# Patient Record
Sex: Male | Born: 1990 | Hispanic: Yes | Marital: Single | Smoking: Never smoker
Health system: Southern US, Community
[De-identification: ages and names within clinical notes are randomized; demographics above are authoritative.]

## PROBLEM LIST (undated history)

## (undated) HISTORY — PX: INNER EAR SURGERY: SHX679

---
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
## 2009-05-22 ENCOUNTER — Encounter (INDEPENDENT_AMBULATORY_CARE_PROVIDER_SITE_OTHER): Payer: Self-pay | Admitting: Nurse Practitioner

## 2009-05-22 ENCOUNTER — Ambulatory Visit: Payer: Self-pay | Admitting: Internal Medicine

## 2009-05-22 DIAGNOSIS — R030 Elevated blood-pressure reading, without diagnosis of hypertension: Secondary | ICD-10-CM | POA: Insufficient documentation

## 2009-05-22 LAB — CONVERTED CEMR LAB
Basophils Relative: 0 % (ref 0–1)
CO2: 22 meq/L (ref 19–32)
Creatinine, Ser: 1.04 mg/dL (ref 0.40–1.50)
Eosinophils Absolute: 0.1 10*3/uL (ref 0.0–0.7)
Eosinophils Relative: 2 % (ref 0–5)
Glucose, Bld: 90 mg/dL (ref 70–99)
HCT: 45.4 % (ref 39.0–52.0)
Hemoglobin: 15.5 g/dL (ref 13.0–17.0)
MCHC: 34.1 g/dL (ref 30.0–36.0)
MCV: 89.4 fL (ref 78.0–100.0)
Monocytes Absolute: 0.7 10*3/uL (ref 0.1–1.0)
Monocytes Relative: 12 % (ref 3–12)
RBC: 5.08 M/uL (ref 4.22–5.81)
Sodium: 139 meq/L (ref 135–145)
TSH: 0.536 microintl units/mL (ref 0.350–4.500)
Total Bilirubin: 0.6 mg/dL (ref 0.3–1.2)
Total Protein: 7.1 g/dL (ref 6.0–8.3)

## 2009-05-23 ENCOUNTER — Encounter (INDEPENDENT_AMBULATORY_CARE_PROVIDER_SITE_OTHER): Payer: Self-pay | Admitting: Nurse Practitioner

## 2009-06-02 ENCOUNTER — Ambulatory Visit: Payer: Self-pay | Admitting: Nurse Practitioner

## 2014-07-05 ENCOUNTER — Emergency Department (HOSPITAL_COMMUNITY)
Admission: EM | Admit: 2014-07-05 | Discharge: 2014-07-05 | Disposition: A | Discharge status: Home / Self Care | Payer: Commercial Managed Care - PPO | Attending: Emergency Medicine | Admitting: Emergency Medicine

## 2014-07-05 ENCOUNTER — Emergency Department (HOSPITAL_COMMUNITY): Payer: Commercial Managed Care - PPO

## 2014-07-05 ENCOUNTER — Encounter (HOSPITAL_COMMUNITY): Payer: Self-pay | Admitting: Emergency Medicine

## 2014-07-05 DIAGNOSIS — R55 Syncope and collapse: Secondary | ICD-10-CM | POA: Insufficient documentation

## 2014-07-05 LAB — CBG MONITORING, ED: Glucose-Capillary: 96 mg/dL (ref 70–99)

## 2014-07-05 MED ORDER — SODIUM CHLORIDE 0.9 % IV BOLUS (SEPSIS)
1000.0000 mL | Freq: Once | INTRAVENOUS | Status: AC
  Administered 2014-07-05: 1000 mL via INTRAVENOUS

## 2014-07-05 NOTE — Discharge Instructions (Signed)
Near-Syncope Near-syncope (commonly known as near fainting) is sudden weakness, dizziness, or feeling like you might pass out. During an episode of near-syncope, you may also develop pale skin, have tunnel vision, or feel sick to your stomach (nauseous). Near-syncope may occur when getting up after sitting or while standing for a long time. It is caused by a sudden decrease in blood flow to the brain. This decrease can result from various causes or triggers, most of which are not serious. However, because near-syncope can sometimes be a sign of something serious, a medical evaluation is required. The specific cause is often not determined. HOME CARE INSTRUCTIONS  Monitor your condition for any changes. The following actions may help to alleviate any discomfort you are experiencing:  Have someone stay with you until you feel stable.  Lie down right away and prop your feet up if you start feeling like you might faint. Breathe deeply and steadily. Wait until all the symptoms have passed. Most of these episodes last only a few minutes. You may feel tired for several hours.   Drink enough fluids to keep your urine clear or pale yellow.   If you are taking blood pressure or heart medicine, get up slowly when seated or lying down. Take several minutes to sit and then stand. This can reduce dizziness.  Follow up with your health care provider as directed. SEEK IMMEDIATE MEDICAL CARE IF:   You have a severe headache.   You have unusual pain in the chest, abdomen, or back.   You are bleeding from the mouth or rectum, or you have black or tarry stool.   You have an irregular or very fast heartbeat.   You have repeated fainting or have seizure-like jerking during an episode.   You faint when sitting or lying down.   You have confusion.   You have difficulty walking.   You have severe weakness.   You have vision problems.  MAKE SURE YOU:   Understand these instructions.  Will  watch your condition.  Will get help right away if you are not doing well or get worse. Document Released: 11/18/2005 Document Revised: 11/23/2013 Document Reviewed: 04/23/2013 Exit[employer] Patient Information 2015 [employer]. This information is not intended to replace advice given to you by your health care provider. Make sure you discuss any questions you have with your health care provider.  

## 2014-07-05 NOTE — ED Notes (Signed)
Per EMS, pt c/o near syncope. Pt was at work ( very hot environment per EMS) felt dizzy and hot. Pt felt near syncope so he laid on the floor before it occured. Pt denies loss of consciousness, injury,pain or nausea. No neuro deficit noted. Pt was treated with 500 cc NS prior to arrival. Per EMS pt had positive orthostatic VS.

## 2014-07-05 NOTE — ED Notes (Signed)
Patient transported to X-ray 

## 2014-07-05 NOTE — ED Provider Notes (Signed)
CSN: [id number]     Arrival date & time 07/05/14  1704 History   First MD Initiated Contact with Patient 07/05/14 1730     Chief Complaint  Patient presents with  . Near Syncope     (Consider location/radiation/quality/duration/timing/severity/associated sxs/prior Treatment) HPI  23 yo male with a chief complaint of near-syncope. 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 Patient started to feel very lightheaded laid down on the ground the symptoms subsided after a couple minutes. Patient states he has been drinking water okay today. Patient also said he has been eating normally today. Patient has never had this happen before. Patient denies any chest pain shortness of breath headache. On scene patient became very tachypneic and ended up having carpal pedal spasm. This resolved after EMS arrival.   History reviewed. No pertinent past medical history. Past Surgical History  Procedure Laterality Date  . Inner ear surgery Right    History reviewed. No pertinent family history. History  Substance Use Topics  . Smoking status: Never Smoker   . Smokeless tobacco: Not on file  . Alcohol Use: No    Review of Systems  Constitutional: Negative for fever and chills.  HENT: Negative for congestion and facial swelling.   Eyes: Negative for discharge and visual disturbance.  Respiratory: Negative for shortness of breath.   Cardiovascular: Negative for chest pain and palpitations.  Gastrointestinal: Negative for vomiting, abdominal pain and diarrhea.  Musculoskeletal: Negative for arthralgias and myalgias.  Skin: Negative for color change and rash.  Neurological: Negative for tremors, syncope and headaches.       Near syncope   Psychiatric/Behavioral: Negative for confusion and dysphoric mood.      Allergies  Review of patient's allergies indicates no known allergies.  Home Medications   Prior to Admission  medications   Not on File   BP 115/72  Pulse 75  Temp(Src) 98.3 F (36.8 C) (Oral)  Resp 16  Ht 5\' 1"  (1.549 m)  Wt 170 lb (77.111 kg)  BMI 32.14 kg/m2  SpO2 97% Physical Exam  Constitutional: He is oriented to person, place, and time. He appears well-developed and well-nourished.  HENT:  Head: Normocephalic and atraumatic.  Eyes: EOM are normal. Pupils are equal, round, and reactive to light.  Neck: Normal range of motion. Neck supple. No JVD present.  Cardiovascular: Normal rate and regular rhythm.  Exam reveals no gallop and no friction rub.   No murmur heard. Pulmonary/Chest: No respiratory distress. He has no wheezes.  Abdominal: He exhibits no distension. There is no rebound and no guarding.  Musculoskeletal: Normal range of motion.  Neurological: He is alert and oriented to person, place, and time.  Skin: No rash noted. No pallor.  Psychiatric: He has a normal mood and affect. His behavior is normal.    ED Course  Procedures (including critical care time) Labs Review Labs Reviewed - No data to display  Imaging Review No results found.   EKG Interpretation None      MDM   Final diagnoses:  None    23 yo M with the chief complaint of near-syncope.  Patient with unremarkable blood glucose EKG chest x-ray. Orthostatic with EMS. Story most likely vasovagal versus dehydration. Patient given fluid bolus with some improvement.  Vitals stable, suspect vasovagal.  Discharged home.    I have discussed the diagnosis/risks/treatment options with the patient and family and believe the pt to be  eligible for discharge home to follow-up with PCP. We also discussed returning to the ED immediately if new or worsening sx occur. We discussed the sx which are most concerning (e.g., repeat event, chest pain, shortness of breath) that necessitate immediate return. Medications administered to the patient during their visit and any new prescriptions provided to the patient are listed  below.  Medications given during this visit Medications  sodium chloride 0.9 % bolus 1,000 mL (0 mLs Intravenous Stopped 07/05/14 1930)    There are no discharge medications for this patient.    Melene Planan Javien Tesch, MD 07/05/14 [phone]

## 2014-07-05 NOTE — ED Notes (Signed)
Pt ambulated to restroom with no assistance. ?

## 2014-07-05 NOTE — ED Notes (Signed)
Pt stated he felt no dizziness while walking to the bathroom.

## 2014-07-06 NOTE — ED Provider Notes (Signed)
I saw and evaluated the patient, reviewed the resident's note and I agree with the findings and plan.   EKG Interpretation   Date/Time:  Tuesday July 05 2014 17:23:45 EDT Ventricular Rate:  76 PR Interval:  145 QRS Duration: 82 QT Interval:  382 QTC Calculation: 429 R Axis:   71 Text Interpretation:  Sinus rhythm Anteroseptal infarct, age indeterminate  ST elevation, consider inferior injury Lateral leads are also involved  inferior q waves noted Confirmed by Haruto Demaria, MD, Janey GentaANKIT [phone]([id number]) on  07/05/2014 5:41:04 PM      Pt comes in with syncope. His cardiac hx, and exam is benign - including fam hx. Appears clinically to be a vasovagal event or orrthostatic event. Monitored in the ER - and had no cardiac events.  Derwood KaplanAnkit Alize Borrayo, MD 07/06/14 [phone]

## 2015-04-10 ENCOUNTER — Ambulatory Visit: Payer: Worker's Compensation

## 2015-04-10 ENCOUNTER — Ambulatory Visit (INDEPENDENT_AMBULATORY_CARE_PROVIDER_SITE_OTHER): Payer: Worker's Compensation | Admitting: Emergency Medicine

## 2015-04-10 VITALS — BP 118/78 | HR 70 | Temp 97.4°F | Resp 16 | Ht 61.0 in | Wt 180.2 lb

## 2015-04-10 DIAGNOSIS — S60212A Contusion of left wrist, initial encounter: Secondary | ICD-10-CM | POA: Diagnosis not present

## 2015-04-10 DIAGNOSIS — M25532 Pain in left wrist: Secondary | ICD-10-CM

## 2015-04-10 MED ORDER — NAPROXEN SODIUM 550 MG PO TABS: 550.0000 mg | ORAL_TABLET | Freq: Two times a day (BID) | ORAL | Status: DC

## 2015-04-10 NOTE — Patient Instructions (Signed)

## 2015-04-10 NOTE — Progress Notes (Signed)
[facility] [address], [place] [id number] [phone]  Date:  04/10/2015   Name:  Ernest Preston   DOB:  10/19/1991   MRN:  [id number]  PCP:  No primary care provider on file.    Chief Complaint: Wrist Injury   History of Present Illness:  Ernest Preston is a 24 y.o. very pleasant male patient who presents with the following:  Injured his wrist when a cylinder fell and struck him in the radial aspect of his wrist He has pain with movement Occurred today. Denies other injury Says worse when uses wrist and less when rests. No improvement with over the counter medications or other home remedies.  Denies other complaint or health concern today.   Patient Active Problem List   Diagnosis Date Noted  . ELEVATED BLOOD PRESSURE WITHOUT DIAGNOSIS OF HYPERTENSION 05/22/2009  . TYMPANIC MEMBRANE PERFORATION, RIGHT EAR 12/02/2006    History reviewed. No pertinent past medical history.  Past Surgical History  Procedure Laterality Date  . Inner ear surgery Right     History  Substance Use Topics  . Smoking status: Never Smoker   . Smokeless tobacco: Not on file  . Alcohol Use: No    Family History  Problem Relation Age of Onset  . Diabetes Mother     No Known Allergies  Medication list has been reviewed and updated.  No current outpatient prescriptions on file prior to visit.   No current facility-administered medications on file prior to visit.    Review of Systems:  Review of Systems  Constitutional: Negative for fever, chills and fatigue.  HENT: Negative for congestion, ear pain, hearing loss, postnasal drip, rhinorrhea and sinus pressure.   Eyes: Negative for discharge and redness.  Respiratory: Negative for cough, shortness of breath and wheezing.   Cardiovascular: Negative for chest pain and leg swelling.  Gastrointestinal: Negative for nausea, vomiting, abdominal pain, constipation and blood in stool.  Genitourinary: Negative for  dysuria, urgency and frequency.  Musculoskeletal: Negative for neck stiffness.  Skin: Negative for rash.  Neurological: Negative for seizures, weakness and headaches.   Physical Examination: Filed Vitals:   04/10/15 1405  BP: 118/78  Pulse: 70  Temp: 97.4 F (36.3 C)  Resp: 16   Filed Vitals:   04/10/15 1405  Height: 5\' 1"  (1.549 m)  Weight: 180 lb 3.2 oz (81.738 kg)   Body mass index is 34.07 kg/(m^2). Ideal Body Weight: Weight in (lb) to have BMI = 25: 132  GEN: WDWN, NAD, Non-toxic, A & O x 3 HEENT: Atraumatic, Normocephalic. Neck supple. No masses, No LAD. Ears and Nose: No external deformity. CV: RRR, No M/G/R. No JVD. No thrill. No extra heart sounds. PULM: CTA B, no wheezes, crackles, rhonchi. No retractions. No resp. distress. No accessory muscle use. ABD: S, NT, ND, +BS. No rebound. No HSM. EXTR: No c/c/e NEURO Normal gait.  PSYCH: Normally interactive. Conversant. Not depressed or anxious appearing.  Calm demeanor.  LEFT wrist:  No deformity, ecchymosis.  Some radial tenderness.  Full AROM.  Assessment and Plan: 1. Wrist pain, acute, left anaprox - DG Wrist Complete Left  2. Contusion, wrist, left, initial encounter Anaprox RICE Cock up splint Light duty for 1 week    Signed Phillips OdorJeffery Laneshia Pina, MD   [facility] reading (PRIMARY) by  Dr. Dareen PianoAnderson.  negative.

## 2015-04-18 ENCOUNTER — Ambulatory Visit (INDEPENDENT_AMBULATORY_CARE_PROVIDER_SITE_OTHER): Payer: Worker's Compensation | Admitting: Family Medicine

## 2015-04-18 VITALS — BP 114/66 | HR 98 | Temp 97.9°F | Resp 18 | Ht 61.0 in | Wt 180.0 lb

## 2015-04-18 DIAGNOSIS — S66911D Strain of unspecified muscle, fascia and tendon at wrist and hand level, right hand, subsequent encounter: Secondary | ICD-10-CM

## 2015-04-18 MED ORDER — PREDNISONE 20 MG PO TABS: 40.0000 mg | ORAL_TABLET | Freq: Every day | ORAL | Status: DC

## 2015-04-18 NOTE — Patient Instructions (Signed)
Please return on Friday so we can check your wrist again see how it is coming.

## 2015-04-18 NOTE — Progress Notes (Signed)
° °  Subjective:    Patient ID: Ernest Preston, male    DOB: 10/01/1991, 24 y.o.   MRN: [id number] This chart was scribed for [name], MD by Jolene Provostobert Halas, Medical Scribe. This patient was seen in Room 3 and the patient's care was started a 6:15 PM.  Chief Complaint  Patient presents with   Follow-up    wrist injury     HPI HPI Comments: Ernest Preston is a 24 y.o. male who [facility] complaining of left wrist injury that occurred eight days ago at work. Pt states he was hit by a falling object on his right wrist. Pt [facility] after his injury and was given a splint and put on light duty. Pt states he attempted to go back to normal duty at work yesterday, and experienced pain, which is why [facility]. Pt also states he has pain with heavy lifting.     Review of Systems  Constitutional: Negative for fever and chills.  Musculoskeletal: Negative for joint swelling.       Right wrist pain  Skin: Negative for color change and wound.  Neurological: Positive for weakness (Secondary to pain). Negative for numbness.       Objective:   Physical Exam  Constitutional: He is oriented to person, place, and time. He appears well-developed and well-nourished. No distress.  Pt has congenital nystagmus  HENT:  Head: Normocephalic and atraumatic.  Eyes: Pupils are equal, round, and reactive to light.  Neck: Neck supple.  Cardiovascular: Normal rate.   Pulmonary/Chest: Effort normal. No respiratory distress.  Musculoskeletal: Normal range of motion.  Tender over distal radial styloid   Neurological: He is alert and oriented to person, place, and time. Coordination normal.  Skin: Skin is warm and dry. He is not diaphoretic.  Psychiatric: He has a normal mood and affect. His behavior is normal.  Nursing note and vitals reviewed.      Assessment & Plan:   This chart was scribed in my presence and reviewed by me personally.    ICD-9-CM ICD-10-CM   1. Wrist strain, right, subsequent encounter V58.89 S66.911D predniSONE (DELTASONE) 20 MG tablet   842.00     Patient is developing some features of deQuervain's synovitis.  We'll recheck his wrist in several days and he's not better consider injection at the superficial radial nerve  Signed, [name], MD

## 2015-04-19 ENCOUNTER — Telehealth: Payer: Self-pay

## 2015-04-19 NOTE — Telephone Encounter (Signed)
Pt was seen for w/c injury yesterday by dr Milus Glazierlauenstein. 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

## 2015-04-19 NOTE — Telephone Encounter (Signed)
Dr L- Please address.

## 2015-04-21 ENCOUNTER — Ambulatory Visit (INDEPENDENT_AMBULATORY_CARE_PROVIDER_SITE_OTHER): Payer: Worker's Compensation | Admitting: Family Medicine

## 2015-04-21 VITALS — BP 112/64 | HR 98 | Temp 98.9°F | Resp 18 | Ht 61.0 in | Wt 178.0 lb

## 2015-04-21 DIAGNOSIS — S60212D Contusion of left wrist, subsequent encounter: Secondary | ICD-10-CM | POA: Diagnosis not present

## 2015-04-21 NOTE — Progress Notes (Signed)
Subjective:    Patient ID: Ernest Preston, male    DOB: 02/17/1991, 24 y.o.   MRN: [id number] This chart was scribed for Ernest SidleKurt Lauenstein, MD by Chestine SporeSoijett Blue, ED Scribe. The patient was seen in room 12 at 4:19 PM.   Chief Complaint  Patient presents with   Follow-up    W/C wrist    HPI  Ernest Preston is a 24 y.o. male who presents today complaining of f/u for workers comp. He states that he is having associated symptoms of mild myalgia of the left wrist. He states that he has tried splint and Rx medications with relief for his symptoms. He denies joint swelling, left shoulder pain, left elbow pain, and any other symptoms.   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   Prior Note 04/18/15:  Ernest Friesddie Shambley is a 24 y.o. male who [facility] of left wrist injury that occurred eight days ago at work. Pt states he was hit by a falling object on his right wrist. [facility] his injury and was given a splint and put on light duty. Pt states he attempted to go back to normal duty at work yesterday, and experienced pain, which is [facility]. Pt also states he has pain with heavy lifting.     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 No pertinent past medical history. Past Surgical History  Procedure Laterality Date   Inner ear surgery Right    No Known Allergies Prior to Admission medications   Medication Sig Start Date End Date Taking? Authorizing Provider  naproxen sodium (ANAPROX DS) 550 MG tablet Take 1 tablet (550 mg total) by mouth 2 (two) times daily with a meal. 04/10/15 04/09/16 Yes Carmelina DaneJeffery S Anderson, MD  predniSONE (DELTASONE) 20 MG tablet Take 2 tablets (40 mg total) by mouth daily with breakfast. 04/18/15  Yes Ernest SidleKurt Lauenstein, MD       Review of Systems  Musculoskeletal: Positive for myalgias. Negative for joint swelling and arthralgias.  Skin: Negative for rash.  Neurological: Negative for weakness and numbness.       Objective:   Physical Exam  Constitutional: He is oriented to person, place, and time. He appears well-developed and well-nourished. No distress.  HENT:  Head: Normocephalic and atraumatic.  Eyes: EOM are normal.  Neck: Neck supple. No tracheal deviation present.  Cardiovascular: Normal rate.   Pulmonary/Chest: Effort normal. No respiratory distress.  Musculoskeletal: Normal range of motion.       Left wrist: He exhibits normal range of motion, no tenderness and no swelling.  FROM of the left wrist with no point tenderness. No swelling noted. No ecchymosis of the skin. Neg tenials and finkelstein test.   Neurological: He is alert and oriented to person, place, and time.  Skin: Skin is warm and dry.  Contusion to the left wrist as reported in previous notes.   Psychiatric: He has a normal mood and affect. His behavior is normal.  Nursing note and vitals reviewed.        BP 112/64 mmHg   Pulse 98   Temp(Src) 98.9 F (37.2 C)   Resp 18   Ht 5\' 1"  (1.549 m)   Wt 178 lb (80.74 kg)   BMI 33.65 kg/m2   SpO2 98%  Assessment & Plan:  DIAGNOSTIC STUDIES: Oxygen Saturation is 98% on RA, nl by my interpretation.    COORDINATION OF CARE: 4:25 PM-Discussed treatment plan which includes f/u in 1 week for a f/u with pt at bedside and pt agreed to plan.  This chart was scribed in my presence and reviewed by me personally.    ICD-9-CM ICD-10-CM   1. Wrist contusion, left, subsequent encounter V58.89 Z61.096ES60.212D    923.21       Signed, Ernest SidleKurt Lauenstein, MD

## 2015-04-23 NOTE — Telephone Encounter (Signed)
Note ready for pick up. Patient informed.

## 2015-04-28 ENCOUNTER — Ambulatory Visit (INDEPENDENT_AMBULATORY_CARE_PROVIDER_SITE_OTHER): Payer: Worker's Compensation | Admitting: Family Medicine

## 2015-04-28 VITALS — BP 124/72 | HR 96 | Temp 97.6°F | Resp 16 | Ht 61.0 in | Wt 182.8 lb

## 2015-04-28 DIAGNOSIS — S60212D Contusion of left wrist, subsequent encounter: Secondary | ICD-10-CM

## 2015-04-28 NOTE — Progress Notes (Signed)
Patient is a 24 year old male who presents for follow-up complaining of left wrist injury that occurred on May 9. He's hit by falling object on his wrist and he was given a splint put on light duty.  Patient reports that his wrist is gradually getting better. He has pain with heavy lifting so we have put him on light duty.  Objective: Left wrist shows no swelling or ecchymosis. He has mild tenderness in the radial  anatomical snuffbox. Range of motion is now normal.  Assessment: Gradual improvement. Still has some pain so I think protection is indicated.  Plan: Light duty another 5 days coupled with use of splint. Return on June 2 for anticipated final visit.  Signed, Sheila OatsKurt Jamilette Suchocki M.D.

## 2015-05-05 ENCOUNTER — Ambulatory Visit (INDEPENDENT_AMBULATORY_CARE_PROVIDER_SITE_OTHER): Payer: Worker's Compensation | Admitting: Emergency Medicine

## 2015-05-05 VITALS — BP 106/76 | HR 71 | Temp 98.1°F | Resp 16 | Ht 60.5 in | Wt 178.2 lb

## 2015-05-05 DIAGNOSIS — S60212D Contusion of left wrist, subsequent encounter: Secondary | ICD-10-CM

## 2015-05-05 NOTE — Progress Notes (Signed)
Subjective:  Patient ID: Ernest Preston, male    DOB: 08/21/1991  Age: 24 y.o. MRN: [id number]  CC: Follow-up   HPI Ernest Friesddie Santana presents  for evaluation of his left wrist. He was injured initially on 04/10/2015. He was struck in the wrist by a cylinder of some sort. He was treated conservatively and has improved. He has no pain. No limitation of motion. 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 And he wishes to return to work full duty. Is not taking any medication currently  Outpatient Prescriptions Prior to Visit  Medication Sig Dispense Refill  . predniSONE (DELTASONE) 20 MG tablet Take 2 tablets (40 mg total) by mouth daily with breakfast. (Patient not taking: Reported on 04/28/2015) 10 tablet 0  . naproxen sodium (ANAPROX DS) 550 MG tablet Take 1 tablet (550 mg total) by mouth 2 (two) times daily with a meal. (Patient not taking: Reported on 04/28/2015) 40 tablet 0   No facility-administered medications prior to visit.    History   Social History  . Marital Status: Single    Spouse Name: N/A  . Number of Children: N/A  . Years of Education: N/A   Social History Main Topics  . Smoking status: Never Smoker   . Smokeless tobacco: Not on file  . Alcohol Use: No  . Drug Use: No  . Sexual Activity: Not on file   Other Topics Concern  . None   Social History Narrative    Family History  Problem Relation Age of Onset  . Diabetes Mother     No past medical history on file.   Review of Systems  Constitutional: Negative for fever, chills and appetite change.  HENT: Negative for congestion, ear pain, postnasal drip, sinus pressure and sore throat.   Eyes: Negative for pain and redness.  Respiratory: Negative for cough, shortness of breath and wheezing.   Cardiovascular: Negative for leg swelling.  Gastrointestinal: Negative for nausea, vomiting, abdominal pain, diarrhea, constipation and blood in stool.  Endocrine: Negative for polyuria.    Genitourinary: Negative for dysuria, urgency, frequency and flank pain.  Musculoskeletal: Negative for gait problem.  Skin: Negative for rash.  Neurological: Negative for weakness and headaches.  Psychiatric/Behavioral: Negative for confusion and decreased concentration. The patient is not nervous/anxious.     Objective:  BP 106/76 mmHg  Pulse 71  Temp(Src) 98.1 F (36.7 C) (Oral)  Resp 16  Ht 5' 0.5" (1.537 m)  Wt 178 lb 3.2 oz (80.831 kg)  BMI 34.22 kg/m2  SpO2 98%  BP Readings from Last 3 Encounters:  05/05/15 106/76  04/28/15 124/72  04/21/15 112/64    Wt Readings from Last 3 Encounters:  05/05/15 178 lb 3.2 oz (80.831 kg)  04/28/15 182 lb 12.8 oz (82.918 kg)  04/21/15 178 lb (80.74 kg)    Physical Exam  Constitutional: He is oriented to person, place, and time. He appears well-developed and well-nourished.  HENT:  Head: Normocephalic and atraumatic.  Eyes: Conjunctivae are normal. Pupils are equal, round, and reactive to light.  Pulmonary/Chest: Effort normal.  Musculoskeletal: He exhibits no edema.  Neurological: He is alert and oriented to person, place, and time.  Skin: Skin is dry.  Psychiatric: He has a normal mood and affect. His behavior is normal. Thought content normal.    Lab Results  Component Value Date   WBC 6.3 05/22/2009   HGB 15.5 05/22/2009   HCT 45.4 05/22/2009   PLT 219 05/22/2009   GLUCOSE 90 05/22/2009  ALT 13 05/22/2009   AST 18 05/22/2009   NA 139 05/22/2009   K 4.1 05/22/2009   CL 106 05/22/2009   CREATININE 1.04 05/22/2009   BUN 16 05/22/2009   CO2 22 05/22/2009   TSH 0.536 05/22/2009      .  Assessment & Plan:   Bralon was seen today for follow-up.  Diagnoses and all orders for this visit:  Contusion, wrist, left, subsequent encounter   I have discontinued Mr. Fouts's naproxen sodium. I am also having him maintain his predniSONE.  No orders of the defined types were placed in this encounter.     Appropriate red flag conditions were discussed with the patient as well as actions that should be taken.  Patient expressed his understanding.  Follow-up: Return if symptoms worsen or fail to improve.  Carmelina Dane, MD

## 2016-02-19 ENCOUNTER — Ambulatory Visit (INDEPENDENT_AMBULATORY_CARE_PROVIDER_SITE_OTHER): Payer: Worker's Compensation | Admitting: Family Medicine

## 2016-02-19 ENCOUNTER — Ambulatory Visit: Payer: Worker's Compensation | Admitting: Family Medicine

## 2016-02-19 ENCOUNTER — Encounter: Payer: Self-pay | Admitting: Family Medicine

## 2016-02-19 VITALS — BP 110/70 | HR 60 | Temp 97.4°F | Resp 16 | Ht 60.75 in | Wt 182.2 lb

## 2016-02-19 DIAGNOSIS — S39012A Strain of muscle, fascia and tendon of lower back, initial encounter: Secondary | ICD-10-CM

## 2016-02-19 DIAGNOSIS — Y990 Civilian activity done for income or pay: Secondary | ICD-10-CM | POA: Diagnosis not present

## 2016-02-19 MED ORDER — METHOCARBAMOL 500 MG PO TABS: ORAL_TABLET | ORAL | Status: DC

## 2016-02-19 MED ORDER — NAPROXEN 500 MG PO TABS: 500.0000 mg | ORAL_TABLET | Freq: Two times a day (BID) | ORAL | Status: DC

## 2016-02-19 NOTE — Progress Notes (Signed)
Patient ID: Ernest Preston, male    DOB: 11/01/1991  Age: 25 y.o. MRN: [id number]  No chief complaint on file.   Subjective:   Patient was here earlier and I saw him. There is a separate note on him. 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 Please use the note from earlier if necessary.  Current allergies, medications, problem list, past/family and social histories reviewed.  Objective:  There were no vitals taken for this visit.  .  Assessment & Plan:   Assessment: No diagnosis found.    Plan: .  No orders of the defined types were placed in this encounter.    No orders of the defined types were placed in this encounter.         There are no Patient Instructions on file for this visit.   No Follow-up on file.   Brinleigh Tew, MD 02/19/2016
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
## 2016-02-19 NOTE — Patient Instructions (Addendum)
Apply ice for about 15 or 20 minutes, followed by some heat, 2 or 3 times daily to low back  Do gentle stretching exercises  Avoid heavy lifting or straining of the low back. Recommend trying to work light duty.  Take naproxen 500 mg one twice daily for pain and inflammation  In addition can take over-the-counter Tylenol (acetaminophen) 500 mg 2 tablets 3 times daily if needed for pain relief  Take the methocarbamol muscle relaxant one pill in the morning, 1 in the afternoon, and 2 at bedtime  Return Friday or Saturday for a recheck  Even though this has not been injured as a workers compensation, since the paperwork was done at the workplace I will give you a [employer]. T take back to your employer and discussed this.    IF you received an x-ray today, you will receive an invoice from [facility]. Please contact [facility] at [phone] with questions or concerns regarding your invoice.   IF you received labwork today, you will receive an invoice from [facility]. Please contact [facility] at [phone] with questions or concerns regarding your invoice.   Our billing staff will not be able to assist you with questions regarding bills from these companies.  You will be contacted with the lab results as soon as they are available. The fastest way to get your results is to activate your My Chart account. Instructions are located on the last page of this paperwork. If you have not heard from us regarding the results in 2 weeks, please contact this office.

## 2016-02-23 ENCOUNTER — Ambulatory Visit (INDEPENDENT_AMBULATORY_CARE_PROVIDER_SITE_OTHER): Payer: Worker's Compensation | Admitting: Internal Medicine

## 2016-02-23 VITALS — BP 116/64 | HR 70 | Temp 97.7°F | Resp 16 | Ht 61.0 in | Wt 186.0 lb

## 2016-02-23 DIAGNOSIS — Y990 Civilian activity done for income or pay: Secondary | ICD-10-CM | POA: Diagnosis not present

## 2016-02-23 DIAGNOSIS — S39012A Strain of muscle, fascia and tendon of lower back, initial encounter: Secondary | ICD-10-CM

## 2016-02-23 MED ORDER — METHOCARBAMOL 500 MG PO TABS
ORAL_TABLET | ORAL | Status: DC
Start: 2016-02-23 — End: 2017-03-04

## 2016-02-23 NOTE — Progress Notes (Signed)
   Subjective:  By signing my name below, I, Stann Oresung-Kai Tsai, attest that this documentation has been prepared under the direction and in the presence of Ellamae Siaobert Lysle Yero, MD. Electronically Signed: Stann Oresung-Kai Tsai, Scribe. 02/23/2016 , 12:22 PM .  Patient was seen in Room 9 .   Patient ID: Ernest Preston, male    DOB: 12/13/1990, 25 y.o.   MRN: [id number] Chief Complaint  Patient presents with  . Follow-up    back pain wc    HPI Ernest Friesddie Edds is a 25 y.o. male who [facility]. He was seen on 02/19/16 by Dr. Alwyn RenHopper for lumbar strain noticed while at work. 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 He was prescribed naproxen and robaxin.   He states that he's feeling generally better. He was able to return to work on light duty without hurting himself. If he stands up for a long time, he does feel some pulling pain in his back. He informs that he was able to get naproxen but not the muscle relaxer.   No Known Allergies   Current outpatient prescriptions:  .  naproxen (NAPROSYN) 500 MG tablet, Take 1 tablet (500 mg total) by mouth 2 (two) times daily with a meal., Disp: 30 tablet, Rfl: 0 .  methocarbamol (ROBAXIN) 500 MG tablet, Take 1 in the morning, 1 in the afternoon, and 2 at bedtime for muscle relaxant (Patient not taking: Reported on 02/23/2016), Disp: 40 tablet, Rfl: 0   Review of Systems  Constitutional: Negative for fever, chills and fatigue.  Gastrointestinal: Negative for nausea and vomiting.  Musculoskeletal: Positive for back pain. Negative for myalgias, joint swelling, arthralgias, gait problem, neck pain and neck stiffness.  Skin: Negative for rash and wound.  Neurological: Negative for dizziness, weakness and light-headedness.       Objective:   Physical Exam  Constitutional: He is oriented to person, place, and time. He appears well-developed and well-nourished. No distress.  HENT:  Head: Normocephalic and atraumatic.  Eyes: EOM are  normal. Pupils are equal, round, and reactive to light.  Neck: Neck supple.  Cardiovascular: Normal rate.   Pulmonary/Chest: Effort normal. No respiratory distress.  Musculoskeletal: Normal range of motion.  Non tender over thoracic and lumbar area without pain with rom, able to straight leg raise to 90 degrees bilaterally  Neurological: He is alert and oriented to person, place, and time.  Skin: Skin is warm and dry.  Psychiatric: He has a normal mood and affect. His behavior is normal.  Nursing note and vitals reviewed.  BP 116/64 mmHg  Pulse 70  Temp(Src) 97.7 F (36.5 C) (Oral)  Resp 16  Ht 5\' 1"  (1.549 m)  Wt 186 lb (84.369 kg)  BMI 35.16 kg/m2  SpO2 98%    Assessment & Plan:  I have completed the patient encounter in its entirety as documented by the scribe, with editing by me where necessary. Zyanna Leisinger P. Merla Richesoolittle, M.D.  Lumbar strain, initial encounter - Plan: methocarbamol (ROBAXIN) 500 MG tablet  Work related injury - Plan: methocarbamol (ROBAXIN) 500 MG tablet  Meds ordered this encounter  Medications  . methocarbamol (ROBAXIN) 500 MG tablet    Sig: Take 1 in the morning  and 2 at bedtime for muscle relaxant    Dispense:  40 tablet    Refill:  0    Workers compensation   Increase work slowly Exercises

## 2016-03-06 ENCOUNTER — Ambulatory Visit (INDEPENDENT_AMBULATORY_CARE_PROVIDER_SITE_OTHER): Payer: Worker's Compensation | Admitting: Physician Assistant

## 2016-03-06 VITALS — BP 110/70 | HR 72 | Temp 97.9°F | Resp 17 | Ht 61.0 in | Wt 183.0 lb

## 2016-03-06 DIAGNOSIS — Y990 Civilian activity done for income or pay: Secondary | ICD-10-CM

## 2016-03-06 DIAGNOSIS — M545 Low back pain, unspecified: Secondary | ICD-10-CM

## 2016-03-06 NOTE — Patient Instructions (Signed)
     IF you received an x-ray today, you will receive an invoice from [facility]. Please contact [facility] at [phone] with questions or concerns regarding your invoice.   IF you received labwork today, you will receive an invoice from [facility]. Please contact [facility] at [phone] with questions or concerns regarding your invoice.   Our billing staff will not be able to assist you with questions regarding bills from these companies.  You will be contacted with the lab results as soon as they are available. The fastest way to get your results is to activate your My Chart account. Instructions are located on the last page of this paperwork. If you have not heard from us regarding the results in 2 weeks, please contact this office.      

## 2016-03-06 NOTE — Progress Notes (Signed)
   03/06/2016 11:42 AM   DOB: 07/02/1991 / MRN: [id number]  SUBJECTIVE:  Ernest Preston is a 25 y.o. male presenting for the evaluation of improved "sharp" midline back pain that started 15 days ago while lifting at work. Associated symptoms include no other symptoms, and he denies weakness, numbness, tingling, dysuria, leg pain, leg weakness, incontinence.Treatments tried thus far include Rx strength naprosyn and muscle relaxants.    Review of Systems  Constitutional: Negative for fever and chills.  Eyes: Negative for blurred vision.  Respiratory: Negative for cough and shortness of breath.   Cardiovascular: Negative for chest pain.  Gastrointestinal: Negative for nausea and abdominal pain.  Genitourinary: Negative for dysuria, urgency and frequency.  Musculoskeletal: Positive for back pain. Negative for myalgias and falls.  Skin: Negative for rash.  Neurological: Negative for dizziness, tingling and headaches.  Psychiatric/Behavioral: Negative for depression. The patient is not nervous/anxious.     Problem list and medications reviewed and updated by myself where necessary, and exist elsewhere in the encounter.   OBJECTIVE:  BP 110/70 mmHg  Pulse 72  Temp(Src) 97.9 F (36.6 C) (Oral)  Resp 17  Ht 5\' 1"  (1.549 m)  Wt 183 lb (83.008 kg)  BMI 34.60 kg/m2  SpO2 98% CrCl cannot be calculated (Patient has no serum creatinine result on file.).  Physical Exam  Constitutional: He is oriented to person, place, and time. He appears well-developed. He does not appear ill.  Eyes: Conjunctivae and EOM are normal. Pupils are equal, round, and reactive to light.  Cardiovascular: Normal rate.   Pulmonary/Chest: Effort normal.  Abdominal: He exhibits no distension.  Musculoskeletal: Normal range of motion.       Lumbar back: Normal. He exhibits no tenderness, no swelling and no edema.  Neurological: He is alert and oriented to person, place, and time. He has normal strength and normal  reflexes. No cranial nerve deficit or sensory deficit. He displays a negative Romberg sign. Coordination and gait normal. GCS eye subscore is 4. GCS verbal subscore is 5. GCS motor subscore is 6.  Skin: Skin is warm and dry. He is not diaphoretic.  Psychiatric: He has a normal mood and affect.  Nursing note and vitals reviewed.   No results found for this or any previous visit (from the past 48 hour(s)).  No results found.  ASSESSMENT AND PLAN  Link Snufferddie was seen today for follow-up.  Diagnoses and all orders for this visit:  Work related injury: He is improved and will return for his next scheduled shift without restriction.  Advised 440 otc aleve in the event of future pain.  No follow up necessary.   Midline low back pain without sciatica: Resolving.  See problem 1.     The patient was advised to call or return to clinic if he does not see an improvement in symptoms or to seek the care of the closest emergency department if he worsens with the above plan.   Deliah BostonMichael Dorian Renfro, MHS, PA-C [facility] 03/06/2016 11:42 AM

## 2016-09-24 IMAGING — CR DG WRIST COMPLETE 3+V*L*
2 series · 2 of 2 positions shown · non-contrast
Comparison: None.

CLINICAL DATA: Wrist pain on the left, acute.  No known injury.

EXAM:
LEFT WRIST - COMPLETE 3+ VIEW

[PA]
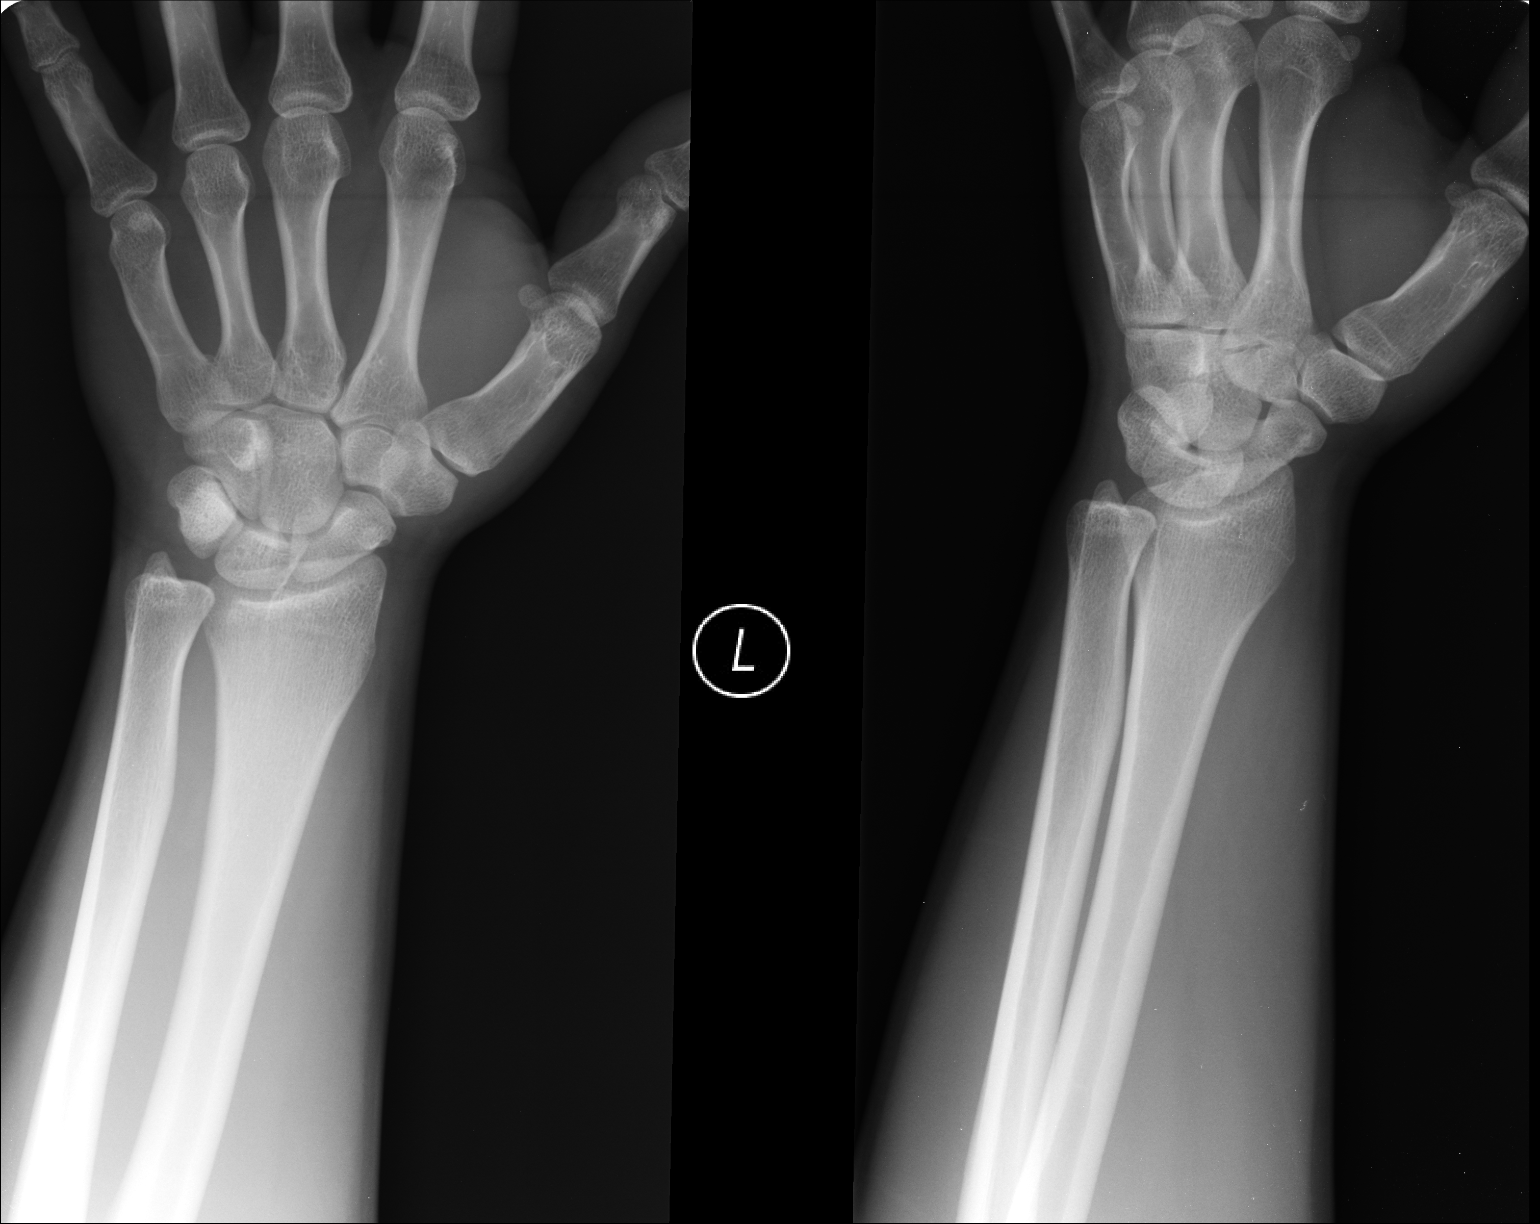

[lateral]
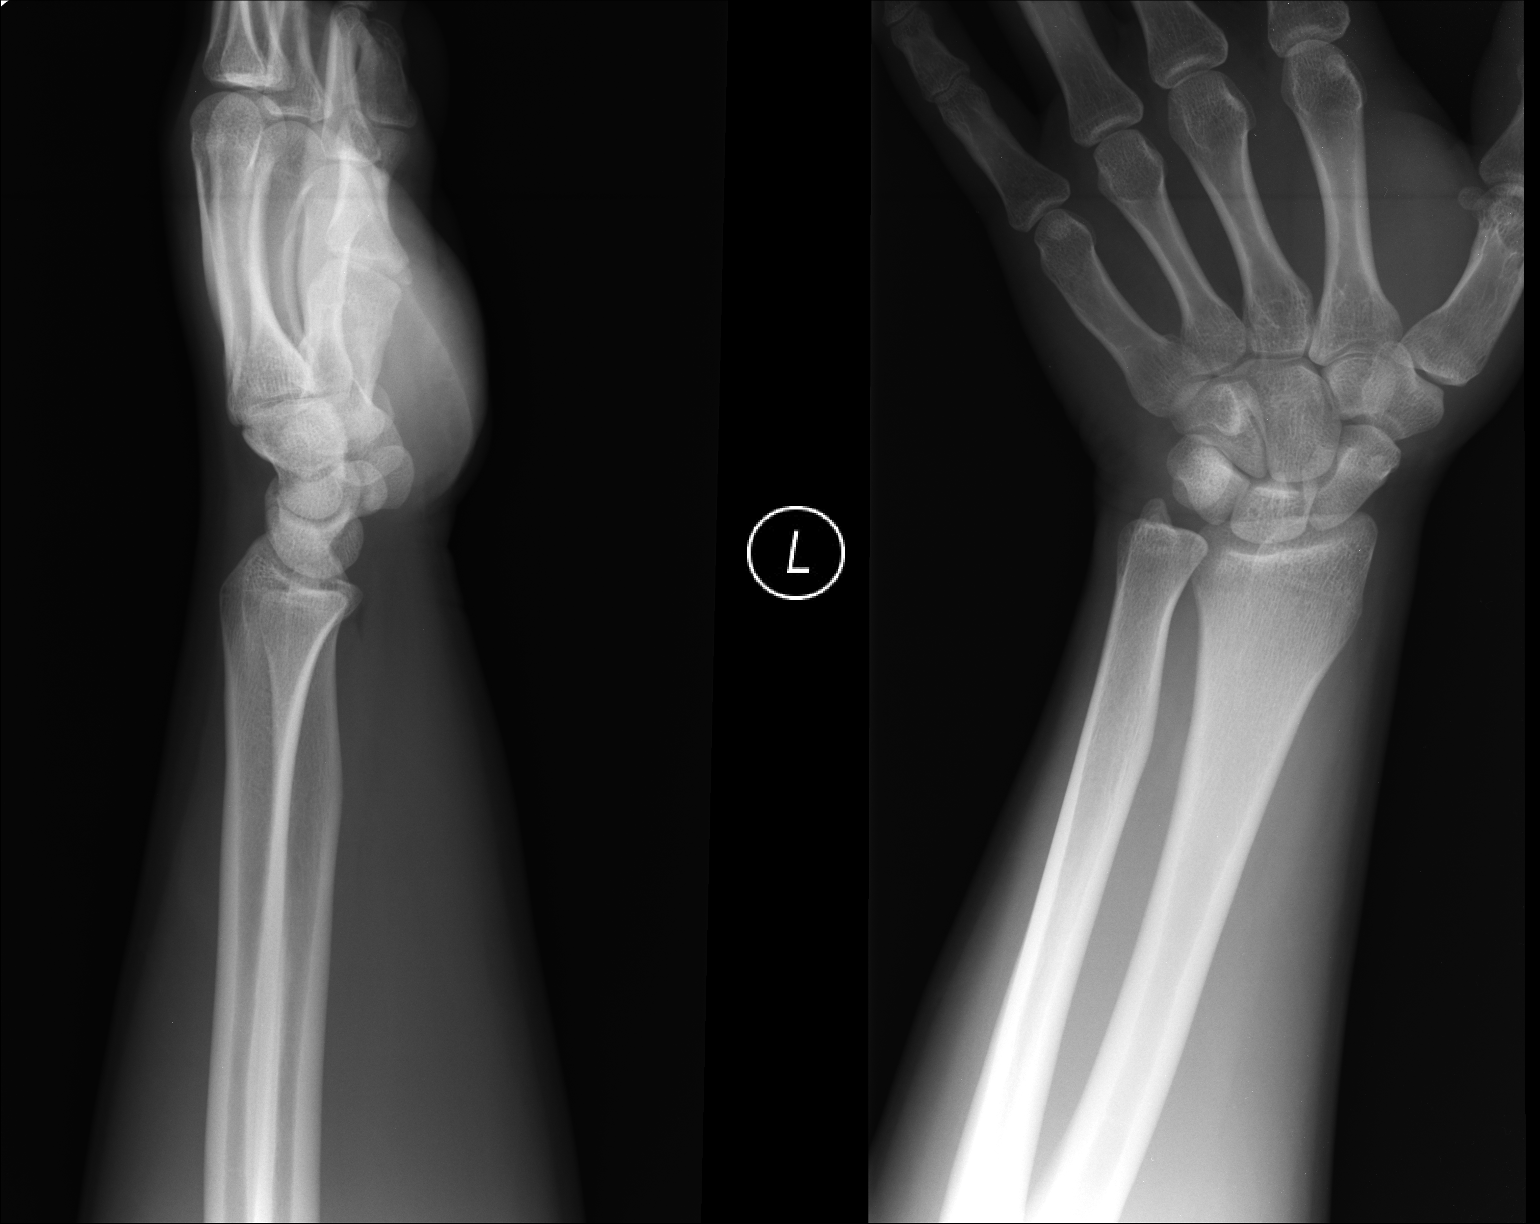

[2 of 2 positions shown; findings below may reference images not displayed]

FINDINGS: There is no evidence of fracture or dislocation. There is no
evidence of arthropathy or other focal bone abnormality. Soft
tissues are unremarkable.
IMPRESSION: Negative.

## 2017-03-04 ENCOUNTER — Ambulatory Visit (INDEPENDENT_AMBULATORY_CARE_PROVIDER_SITE_OTHER): Payer: Worker's Compensation

## 2017-03-04 ENCOUNTER — Ambulatory Visit (INDEPENDENT_AMBULATORY_CARE_PROVIDER_SITE_OTHER): Payer: Worker's Compensation | Admitting: Family Medicine

## 2017-03-04 VITALS — BP 116/78 | HR 64 | Temp 97.3°F | Ht 61.0 in | Wt 184.6 lb

## 2017-03-04 DIAGNOSIS — M25511 Pain in right shoulder: Secondary | ICD-10-CM | POA: Diagnosis not present

## 2017-03-04 DIAGNOSIS — M549 Dorsalgia, unspecified: Secondary | ICD-10-CM

## 2017-03-04 DIAGNOSIS — S39012A Strain of muscle, fascia and tendon of lower back, initial encounter: Secondary | ICD-10-CM | POA: Diagnosis not present

## 2017-03-04 DIAGNOSIS — Y990 Civilian activity done for income or pay: Secondary | ICD-10-CM | POA: Diagnosis not present

## 2017-03-04 MED ORDER — METHOCARBAMOL 500 MG PO TABS
ORAL_TABLET | ORAL | 0 refills | Status: DC
Start: 2017-03-04 — End: 2017-03-22

## 2017-03-04 MED ORDER — NAPROXEN 500 MG PO TABS: 500.0000 mg | ORAL_TABLET | Freq: Two times a day (BID) | ORAL | 0 refills | Status: DC

## 2017-03-04 NOTE — Progress Notes (Signed)
Patient ID: Ernest Preston, male    DOB: May 13, 1991  Age: 26 y.o. MRN: [id number]  Chief Complaint  Patient presents with  . Back Pain    X 1 day  . 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 He was fine this morning. He was at work and lifting a door when he had a sharp burning pain in his left upper back and shoulder area. Then it just tightened up and hurt. He went and told his manager and was sent over here for a Worker's Comp. evaluation.  A couple of years ago he had a back strain, but does not have chronic problems with his back. He is generally healthy. He does not do regular physical exercise but he does do a fairly manual job.  Current allergies, medications, problem list, past/family and social histories reviewed.  Objective:  BP 116/78 (BP Location: Right Arm, Patient Position: Sitting, Cuff Size: Small)   Pulse 64   Temp 97.3 F (36.3 C) (Oral)   Ht  (1.549 m)   Wt 184 lb 9.6 oz (83.7 kg)   SpO2 96%   BMI 34.88 kg/m   Pleasant, alert and oriented gentleman, modestly overweight. No major distress when seated still, but hurts when he moves around. His shoulders and torso appear normal. The right upper back is tender just above the medial half of the scapula, with tenderness down the paraspinous muscles medial to the right scapula and just below the scapula. He is tender along the upper to mid thoracic spine pain in about the fourth to eighth vertebra regions. Motor strength is symmetrical. He has good range of motion except when rotating to the right. It tightens up badly then. Straight leg raising is negative.  Assessment & Plan:   Assessment: 1. Mid back pain on right side   2. Acute pain of right shoulder   3. Lumbar strain, initial encounter   4. Work related injury       Plan: Will get an x-ray. Assuming the vertebra appear normal and the disc spaces looked okay we will treat symptomatically with medications and  rest.  Orders Placed This Encounter  Procedures  . DG Thoracic Spine 2 View    Standing Status:   Future    Number of Occurrences:   1    Standing Expiration Date:   03/04/2018    Order Specific Question:   Reason for Exam (SYMPTOM  OR DIAGNOSIS REQUIRED)    Answer:   mid to upper back pain, acute injury    Order Specific Question:   Preferred imaging location?    Answer:   External    No orders of the defined types were placed in this encounter.   FINDINGS: There is no evidence of thoracic spine fracture. Alignment is normal. Intervertebral disc spaces are maintained. No other significant bone abnormalities are identified.     Patient Instructions   Take naproxen 500 mg one twice daily at breakfast and supper for pain and inflammation in back  For additional pain you can take acetaminophen (Tylenol) 500 mg 2 pills maximum of 3 times daily  For muscle relaxant take the methocarbamol (Robaxin) 500 mg 1 in the morning, 1 in the afternoon, 2 at bedtime  Try alternating warm and cold packs on your back several times daily, to try and break the tightness of the muscle. Do gentle stretching but nothing vigorous.  Stay out of work  and return Friday for a recheck.    IF you received an x-ray today, you will receive an invoice from [facility]. Please contact [facility] at [phone] with questions or concerns regarding your invoice.   IF you received labwork today, you will receive an invoice from [facility]. Please contact [facility] at [phone] with questions or concerns regarding your invoice.   Our billing staff will not be able to assist you with questions regarding bills from these companies.  You will be contacted with the lab results as soon as they are available. The fastest way to get your results is to activate your My Chart account. Instructions are located on the last page of this paperwork. If you have not heard from [place] regarding the results in 2  weeks, please contact this office.         Return in about 3 days (around 03/07/2017).   Lyana Asbill, MD 03/04/2017

## 2017-03-04 NOTE — Patient Instructions (Addendum)
Take naproxen 500 mg one twice daily at breakfast and supper for pain and inflammation in back  For additional pain you can take acetaminophen (Tylenol) 500 mg 2 pills maximum of 3 times daily  For muscle relaxant take the methocarbamol (Robaxin) 500 mg 1 in the morning, 1 in the afternoon, 2 at bedtime  Try alternating warm and cold packs on your back several times daily, to try and break the tightness of the muscle. Do gentle stretching but nothing vigorous.  Stay out of work and return Friday for a recheck.    IF you received an x-ray today, you will receive an invoice from [facility]. Please contact [facility] at [phone] with questions or concerns regarding your invoice.   IF you received labwork today, you will receive an invoice from [facility]. Please contact [facility] at [phone] with questions or concerns regarding your invoice.   Our billing staff will not be able to assist you with questions regarding bills from these companies.  You will be contacted with the lab results as soon as they are available. The fastest way to get your results is to activate your My Chart account. Instructions are located on the last page of this paperwork. If you have not heard from [place] regarding the results in 2 weeks, please contact this office.

## 2017-03-07 ENCOUNTER — Ambulatory Visit (INDEPENDENT_AMBULATORY_CARE_PROVIDER_SITE_OTHER): Payer: Worker's Compensation | Admitting: Physician Assistant

## 2017-03-07 VITALS — BP 117/70 | HR 67 | Temp 97.8°F | Resp 17 | Ht 61.0 in | Wt 188.0 lb

## 2017-03-07 DIAGNOSIS — M25511 Pain in right shoulder: Secondary | ICD-10-CM

## 2017-03-07 DIAGNOSIS — M549 Dorsalgia, unspecified: Secondary | ICD-10-CM

## 2017-03-07 NOTE — Patient Instructions (Addendum)
Continue the prescribed medications, as needed. Take it easy this weekend, and you should be ready to go back to regular work on Monday!    IF you received an x-ray today, you will receive an invoice from [facility]. Please contact [facility] at [phone] with questions or concerns regarding your invoice.   IF you received labwork today, you will receive an invoice from [facility]. Please contact [facility] at [phone] with questions or concerns regarding your invoice.   Our billing staff will not be able to assist you with questions regarding bills from these companies.  You will be contacted with the lab results as soon as they are available. The fastest way to get your results is to activate your My Chart account. Instructions are located on the last page of this paperwork. If you have not heard from [place] regarding the results in 2 weeks, please contact this office.

## 2017-03-07 NOTE — Progress Notes (Signed)
     Ernest Preston 1991-07-18 26 y.o.   Chief Complaint  Patient presents with  . Follow-up    W/C back pain     Presents for evaluation of work-related complaint.  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 He presented here for evaluation. Radiographs were normal. He was prescribed naproxen, acetaminophen and methocarbamol, and advised to stay out of work and return today for re-evaluation.  Improving. 85% improved. Now 0/10, 3/10 with movement. Continues prescribed medications, tolerating them well. Thinks that they are helping. No radicular pain. No paresthesias. No weakness. The neck "stiffs up."   ROS As above.    Current medications and allergies reviewed and updated. Past medical history, family history, social history have been reviewed and updated.   Physical Exam  Constitutional: He is oriented to person, place, and time and well-developed, well-nourished, and in no distress.  BP 117/70   Pulse 67   Temp 97.8 F (36.6 C) (Oral)   Resp 17   Ht  (1.549 m)   Wt 188 lb (85.3 kg)   SpO2 97%   BMI 35.52 kg/m    Eyes: Conjunctivae are normal.  Neck: Normal range of motion. Neck supple. No tracheal deviation present. No thyromegaly present.  Pulmonary/Chest: Effort normal.  Musculoskeletal:       Right shoulder: Normal.       Left shoulder: Normal.       Cervical back: Normal.       Thoracic back: Normal.  Mild "tightness" with palpation of the RIGHT paraspinous muscles of the cervical spine and in the RIGHT trapezius.  Lymphadenopathy:    He has no cervical adenopathy.  Neurological: He is alert and oriented to person, place, and time. Gait normal.  Skin: Skin is warm and dry.  Psychiatric: Mood, memory, affect and judgment normal.     Assessment and Plan: 1. Mid back pain on right side 2. Acute pain of right  shoulder Improving. Continue current treatment. RTW Monday 4/09, unless not resolved, in which case he should return here.    Return if symptoms worsen or fail to improve.   Fernande Bras, PA-C [facility]

## 2017-03-10 ENCOUNTER — Telehealth: Payer: Self-pay | Admitting: Family Medicine

## 2017-03-10 ENCOUNTER — Ambulatory Visit (INDEPENDENT_AMBULATORY_CARE_PROVIDER_SITE_OTHER): Payer: Worker's Compensation | Admitting: Physician Assistant

## 2017-03-10 VITALS — BP 131/82 | HR 67 | Temp 98.3°F | Resp 18 | Ht 61.0 in | Wt 185.0 lb

## 2017-03-10 DIAGNOSIS — M549 Dorsalgia, unspecified: Secondary | ICD-10-CM | POA: Diagnosis not present

## 2017-03-10 NOTE — Progress Notes (Signed)
     Ernest Preston 02-Mar-1991 26 y.o.   Chief Complaint  Patient presents with  . Follow-up    w/c    Presents for evaluation of work-related complaint.  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 He presented here for evaluation. Radiographs were normal. He was prescribed naproxen, acetaminophen and methocarbamol, and advised to stay out of work and return today for re-evaluation.  At follow-up on 03/07/17 he was improving. Pain at rest was rated 0/10, 3/10 with movement. He was tolerating prescribed medications and felt that they are helping. No radicular pain. No paresthesias. No weakness. The neck "stiffs up."  He returned to work today as planned, feeling that he was ready, but as soon as he starting doing the overhead reaching required for his job, the pain recurred. 7/10 at work today. 4/10 now at rest.  No radicular pain. No numbness/tingling. No weakness.  ROM of the neck causes mild pain. No pain with ROM of the arm/shoulder.  ROS As above.    Current medications and allergies reviewed and updated. Past medical history, family history, social history have been reviewed and updated.   Physical Exam  Constitutional: He is oriented to person, place, and time and well-developed, well-nourished, and in no distress.  BP 131/82   Pulse 67   Temp 98.3 F (36.8 C) (Oral)   Resp 18   Ht  (1.549 m)   Wt 185 lb (83.9 kg)   SpO2 97%   BMI 34.96 kg/m    Eyes: Conjunctivae are normal.  Pulmonary/Chest: Effort normal.  Musculoskeletal:       Right shoulder: Normal.       Cervical back: Normal.       Back:       Right upper arm: He exhibits no tenderness, no bony tenderness, no swelling, no edema, no deformity and no laceration.  Neurological: He is alert and oriented to person, place, and time. Gait normal.  Skin: Skin  is warm and dry.  Psychiatric: Mood, memory, affect and judgment normal.     Assessment and Plan: 1. Mid back pain on right side Was improving, but relapse with with return to work. Out of work this week, re-evaluate on Friday 03/14/2017. Continue medications as before.    Return in about 4 days (around 03/14/2017) for re-evaluation.   Fernande Bras, PA-C Primary Care at [facility]

## 2017-03-10 NOTE — Telephone Encounter (Signed)
Please advise can he do this work or totally off work

## 2017-03-10 NOTE — Patient Instructions (Addendum)
Continue the medications. Minimal use of the RIGHT arm. No over head activities. No lifting, pushing, pulling >5 lbs.     IF you received an x-ray today, you will receive an invoice from [facility]. Please contact [facility] at [phone] with questions or concerns regarding your invoice.   IF you received labwork today, you will receive an invoice from [facility]. Please contact [facility] at [phone] with questions or concerns regarding your invoice.   Our billing staff will not be able to assist you with questions regarding bills from these companies.  You will be contacted with the lab results as soon as they are available. The fastest way to get your results is to activate your My Chart account. Instructions are located on the last page of this paperwork. If you have not heard from [place] regarding the results in 2 weeks, please contact this office.

## 2017-03-10 NOTE — Telephone Encounter (Signed)
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

## 2017-03-11 NOTE — Telephone Encounter (Signed)
V/m left for tate with chelles note

## 2017-03-11 NOTE — Telephone Encounter (Signed)
The patient told me that there was no option for light duty. He CAN RTW, minimal use RIGHT arm, no overhead activity, no reaching with the RIGHT arm, no pushing/pulling/lifting >5 lbs with the RIGHT arm.

## 2017-03-12 NOTE — Telephone Encounter (Signed)
Pt needs a letter asap in regards to the update of RTW with minimal use of right arm, etc.... Based on Chelle's previous note.

## 2017-03-12 NOTE — Telephone Encounter (Signed)
l/m for pt to pick up note

## 2017-03-13 ENCOUNTER — Telehealth: Payer: Self-pay | Admitting: Family Medicine

## 2017-03-13 NOTE — Telephone Encounter (Signed)
[employer] where pt work at needed [place] to fax over letter with pt restrictions for work I faxed over letter at 9:24 this morning at fax number [phone]

## 2017-03-14 ENCOUNTER — Ambulatory Visit (INDEPENDENT_AMBULATORY_CARE_PROVIDER_SITE_OTHER): Payer: Worker's Compensation | Admitting: Physician Assistant

## 2017-03-14 VITALS — BP 112/73 | HR 70 | Temp 98.0°F | Resp 17 | Ht 61.0 in | Wt 185.0 lb

## 2017-03-14 DIAGNOSIS — M549 Dorsalgia, unspecified: Secondary | ICD-10-CM

## 2017-03-14 NOTE — Progress Notes (Signed)
     Ernest Preston November 24, 1991 26 y.o.   Chief Complaint  Patient presents with  . Follow-up    WC/ April 3rd at work/ back injury    Presents for evaluation of work-related complaint.  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 He presented here for evaluation. Radiographs were normal. He was prescribed naproxen, acetaminophen and methocarbamol, and advised to stay out of work and return today for re-evaluation.  At follow-up on 03/07/17 he was improving. Pain at rest was rated 0/10, 3/10 with movement. He was tolerating prescribed medications and felt that they are helping. No radicular pain. No paresthesias. No weakness. The neck "stiffs up."  He returned to work 03/10/17 as planned, feeling that he was ready, but as soon as he starting doing the overhead reaching required for his job, the pain recurred. He reported 7/10 at work, 4/10 at rest. He told me that light duty was not an option, so I wrote him out of work, only to receive communication from his employer that light duty was indeed available. He was released to return to work with no lifting >5 lbs, no overhead activities.  Today he reports continued improvement. Pain comes and goes, 2/10. Stiffness, which he relates to decreased activity. He's not having increased pain with the work as restricted. No new pain.  No radicular pain. No numbness/tingling. No weakness.   ROS As above.    Current medications and allergies reviewed and updated. Past medical history, family history, social history have been reviewed and updated.   Physical Exam  Constitutional: He is oriented to person, place, and time and well-developed, well-nourished, and in no distress.  BP 112/73 (BP Location: Right Arm, Patient Position: Sitting, Cuff Size: Normal)   Pulse 70   Temp 98 F (36.7 C) (Oral)    Resp 17   Ht  (1.549 m)   Wt 185 lb (83.9 kg)   SpO2 98%   BMI 34.96 kg/m    Eyes: Conjunctivae are normal.  Cardiovascular:  Pulses:      Radial pulses are 2+ on the right side, and 2+ on the left side.  Pulmonary/Chest: Effort normal.  Musculoskeletal:       Right shoulder: Normal.       Left shoulder: Normal.       Cervical back: Normal.       Thoracic back: He exhibits tenderness, pain and spasm. He exhibits normal range of motion, no bony tenderness, no swelling, no edema, no deformity and no laceration.       Back:  Neurological: He is alert and oriented to person, place, and time. Gait normal.  Skin: Skin is warm and dry.  Psychiatric: Mood, memory, affect and judgment normal.     Assessment and Plan: 1. Mid back pain on right side Advance work restrictions to allow lifting up to 25 lbs and overhead 5 lbs. Continue previously prescribed medications. At home, do gentle ROM to reduce stiffness. Plan to advance again in approximately 1 week.    Return in 8 days (on 03/22/2017) for re-evaluation of back injury/WC.   Fernande Bras, PA-C Primary Care at [facility]

## 2017-03-14 NOTE — Progress Notes (Signed)
THIS NOTE IS USED FOR EDUCATIONAL PURPOSES ONLY!!!   Name: Ernest Preston  DOB: 04/01/1991  Age: 26 y.o. Sex: male  CC:  Chief Complaint  Patient presents with  . Back Injury    WC/ April 3rd at work    PCP: No PCP Per Patient  HPI: Patient returns today for R upper back/shoulder region when he lifted a door he was sanding off a table on 03/04/17. Given naproxen, acetaminophen, and methocarbamol, and advised to stay out of work.   At f/u on 03/07/17 he was improving. Returned to work on 03/10/17 but had pain with doing overhead reaching that is required for his job and pain recurred. He was told to stay out of work until his follow-up today.   Patient reports he has been lifting <5 lbs light duty at work. He reports he has had tightness in his right shoulder/back. He attributes this to "not working that muscle in a while." He denies any pain today. Reports a 2/10 at rest. He reports nothing is making his pain worse right now. No pain when he is doing the 5lb lifting at work.   Denies numbness/tingling.  No radicular pain.  Denies weakness in the arm.     ROS:  Constitutional: Negative for activity change, appetite change, fatigue and unexpected weight change.  HENT: Negative for congestion, dental problem, ear pain, hearing loss, mouth sores, postnasal drip, rhinorrhea, sneezing, sore throat, tinnitus and trouble swallowing.   Eyes: Negative for photophobia, pain, redness and visual disturbance.  Respiratory: Negative for cough, chest tightness and shortness of breath.   Cardiovascular: Negative for chest pain, palpitations and leg swelling.  Gastrointestinal: Negative for abdominal pain, blood in stool, constipation, diarrhea, nausea and vomiting.  Genitourinary: Negative for dysuria, frequency, hematuria and urgency.  Musculoskeletal: Negative for arthralgias, gait problem, myalgias and neck stiffness.  Skin: Negative for rash.  Neurological: Negative for dizziness, speech difficulty,  weakness, light-headedness, numbness and headaches.  Hematological: Negative for adenopathy.  Psychiatric/Behavioral: Negative for confusion and sleep disturbance. The patient is not nervous/anxious.    I have reviewed his PMH, allergies, medications, and social history and have adressed as needed.    PE:  GS: WDWN male sitting on exam table in NAD.  Vitals:  Vitals:   03/14/17 0812  BP: 112/73  Pulse: 70  Resp: 17  Temp: 98 F (36.7 C)   HEENT: Normocephalic, atruamatic. PEARRL. No cervical lymphadenopathy.  Cardiovascular: RRR. No S3 or S4. No murmurs, rubs, or gallops. Pulses 2+ and equal bilateral in the upper and lower extremities. No pitting edema. No varicosities, clubbing, or cyanosis.  Pulm: CTA bilaterally. No expiratory muscle use while breathing.  GI: +BS. NTND. No rigidity or guarding. No rebound tenderness.  MSK: Mild ttp just right of the thoracic spinus processes.  5/5 strength of shoulder. Sensation intact. Full AROM and PROM.  Neuro: CN 2-12 grossly intact.  Psych: A&O x 4. Mood and affect appropriate for situation.  Skin: Warm and dry. No rashes or excoriations on exposed skin.   A&P:  1. Mid back pain on right side - improving slowly.Continue current medications as needed.  Plan: Patient can return to work today with restrictions. He may not pull, push, or lift anything >25 lbs. He may not lift anything overhead >5 lbs beginning today.   F/u in 1 week for reevaluation.      Respectfully,  Camillia Herter, PA-S2

## 2017-03-14 NOTE — Patient Instructions (Signed)
     IF you received an x-ray today, you will receive an invoice from [facility]. Please contact [facility] at [phone] with questions or concerns regarding your invoice.   IF you received labwork today, you will receive an invoice from [facility]. Please contact [facility] at [phone] with questions or concerns regarding your invoice.   Our billing staff will not be able to assist you with questions regarding bills from these companies.  You will be contacted with the lab results as soon as they are available. The fastest way to get your results is to activate your My Chart account. Instructions are located on the last page of this paperwork. If you have not heard from us regarding the results in 2 weeks, please contact this office.     

## 2017-03-22 ENCOUNTER — Encounter: Payer: Self-pay | Admitting: Physician Assistant

## 2017-03-22 ENCOUNTER — Ambulatory Visit (INDEPENDENT_AMBULATORY_CARE_PROVIDER_SITE_OTHER): Payer: Worker's Compensation | Admitting: Physician Assistant

## 2017-03-22 VITALS — BP 102/66 | HR 74 | Temp 97.7°F | Resp 18 | Ht 61.0 in | Wt 186.0 lb

## 2017-03-22 DIAGNOSIS — S39012A Strain of muscle, fascia and tendon of lower back, initial encounter: Secondary | ICD-10-CM | POA: Diagnosis not present

## 2017-03-22 DIAGNOSIS — M549 Dorsalgia, unspecified: Secondary | ICD-10-CM | POA: Diagnosis not present

## 2017-03-22 DIAGNOSIS — Y990 Civilian activity done for income or pay: Secondary | ICD-10-CM

## 2017-03-22 MED ORDER — METHOCARBAMOL 500 MG PO TABS: ORAL_TABLET | ORAL | 0 refills | Status: DC

## 2017-03-22 NOTE — Patient Instructions (Signed)
     IF you received an x-ray today, you will receive an invoice from [facility]. Please contact [facility] at [phone] with questions or concerns regarding your invoice.   IF you received labwork today, you will receive an invoice from [facility]. Please contact [facility] at [phone] with questions or concerns regarding your invoice.   Our billing staff will not be able to assist you with questions regarding bills from these companies.  You will be contacted with the lab results as soon as they are available. The fastest way to get your results is to activate your My Chart account. Instructions are located on the last page of this paperwork. If you have not heard from us regarding the results in 2 weeks, please contact this office.     

## 2017-03-22 NOTE — Progress Notes (Signed)
Ernest Preston 1991/05/02 26 y.o.   Chief Complaint  Patient presents with  . Work Related Injury    follow-up for back pain    Presents for evaluation of work-related complaint.  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 He presented here for evaluation. Radiographs were normal. He was prescribed naproxen, acetaminophen and methocarbamol, and advised to stay out of work and return today for re-evaluation.  At follow-up on 03/07/17 he was improving. Pain at rest was rated0/10, 3/10 with movement. He was tolerating prescribed medications and felt that they are helping. No radicular pain. No paresthesias. No weakness. The neck "stiffs up."  He returned to work 03/10/17 as planned, feeling that he was ready, but as soon as he starting doing the overhead reaching required for his job, the pain recurred. He reported 7/10 at work, 4/10 at rest. He told me that light duty was not an option, so I wrote him out of work, only to receive communication from his employer that light duty was indeed available. He was released to return to work with no lifting >5 lbs, no overhead activities.  On 03/14/2017 he was improving, and pain was intermittent, 2/10. He felt somewhat stiff, which he related to reduced activity. He was able to do his work with the restrictions without pain. On exam, he had mild tenderness of the RIGHT upper back along the medial aspect of the scapula. Work was advanced to allow lifting up to 25 lbs, and overhead work up to 5 lbs. He was advised to continue the prescribed NSAID and muscle relaxer and return today in anticipation of continued advancing of restrictions.  Today he reports feeling continued improvement. Yesterday while lifting a plastic crate/mold and passing it from one hand to the other, he developed pain in the RIGHT shoulder and  numbness/tingling down the RIGHT arm. The pain and paresthesias were intermittent for 60-90 minutes and then recurred at rest later that evening at home, lasting about 30 minutes. He has run out of naproxen and Robaxin.     Review of Systems  Musculoskeletal: Positive for back pain, joint pain and myalgias. Negative for falls and neck pain.  Neurological: Positive for tingling and sensory change. Negative for dizziness, tremors, speech change, focal weakness and headaches.       Current medications and allergies reviewed and updated. Past medical history, family history, social history have been reviewed and updated.   Physical Exam  Constitutional: He is oriented to person, place, and time and well-developed, well-nourished, and in no distress.  BP 102/66   Pulse 74   Temp 97.7 F (36.5 C) (Oral)   Resp 18   Ht  (1.549 m)   Wt 186 lb (84.4 kg)   SpO2 98%   BMI 35.14 kg/m    Eyes: Conjunctivae are normal.  Pulmonary/Chest: Effort normal.  Musculoskeletal:       Right shoulder: Normal.       Cervical back: Normal.       Thoracic back: Normal.  Neurological: He is alert and oriented to person, place, and time. Gait normal.  Skin: Skin is warm and dry.  Psychiatric: Mood, memory, affect and judgment normal.     Assessment and Plan: 1. Mid back pain on right side 2. Work related injury Continued improvement. Advance work, and recheck on 03/26/2017, in anticipation of  returning to full duty. Resume Robaxin, at least at [facility] for the next several days. - methocarbamol (ROBAXIN) 500 MG tablet; Take 1 in the morning, 1 in the afternoon, and 2 at bedtime for muscle relaxant  Dispense: 40 tablet; Refill: 0    Return in 4 days (on 03/26/2017) for re-evaluation with Porfirio Oar, PA-C.   Fernande Bras, PA-C Primary Care at [facility]

## 2017-03-22 NOTE — Progress Notes (Signed)
THIS NOTE IS USED FOR EDUCATIONAL PURPOSES ONLY!!!   Name: Ernest Preston  DOB: Dec 09, 1990  Age: 26 y.o. Sex: male  CC: No chief complaint on file.   PCP: No PCP Per Patient  HPI: Patient returns today for R upper back/shoulder region when he lifted a door he was sanding off a table on 03/04/17. Given naproxen, acetaminophen, and methocarbamol, and advised to stay out of work.   At f/u on 03/07/17 he was improving. Returned to work on 03/10/17 but had pain with doing overhead reaching that is required for his job and pain recurred. Returned to clinic on 03/14/17 and was allowed to return to work with restrictions. He was not allowed to pull, push, or lift anything >25 lbs. He may not lift anything overhead >5 lbs beginning 03/14/17. He is here for f/u today.   Patient reports doing well today. He reports he had no pain since his last visit until yesterday when he was lifting a plastic mold and shifting from one hand to another when he felt a pain in his right shoulder around 9am and felt associated numbness/tingling with the pain. He reports the pain only lasted for a few minutes. He then reports the episode came back later in the day when he wasn't lifting anything and the pain only lasted a few days. He denies pain today. He is not scheduled to work today (03/22/17) or tomorrow (03/23/17). He did not take any pain medications for the episode yesterday. He is currently out of the muscle relaxor.   ROS:  Constitutional: Negative for activity change, appetite change, fatigue and unexpected weight change.  HENT: Negative for congestion, dental problem, ear pain, hearing loss, mouth sores, postnasal drip, rhinorrhea, sneezing, sore throat, tinnitus and trouble swallowing.   Eyes: Negative for photophobia, pain, redness and visual disturbance.  Respiratory: Negative for cough, chest tightness and shortness of breath.   Cardiovascular: Negative for chest pain, palpitations and leg swelling.  Gastrointestinal:  Negative for abdominal pain, blood in stool, constipation, diarrhea, nausea and vomiting.  Genitourinary: Negative for dysuria, frequency, hematuria and urgency.  Musculoskeletal: Negative for arthralgias, gait problem, myalgias and neck stiffness.  Skin: Negative for rash.  Neurological: Negative for dizziness, speech difficulty, weakness, light-headedness, numbness and headaches.  Hematological: Negative for adenopathy.  Psychiatric/Behavioral: Negative for confusion and sleep disturbance. The patient is not nervous/anxious.    I have reviewed his PMH, allergies, medications, and social history and have addressed as needed.    PE:  GS: WDWN male sitting on exam table in NAD.  Vitals: BP 102/66   Pulse 74   Temp 97.7 F (36.5 C) (Oral)   Resp 18   Ht  (1.549 m)   Wt 186 lb (84.4 kg)   SpO2 98%   BMI 35.14 kg/m  HEENT: Normocephalic, atruamatic. PEARRL. No cervical lymphadenopathy. No thyroid nodules, normal size, and equal bilaterally.  Cardiovascular: RRR. No S3 or S4. No murmurs, rubs, or gallops. Pulses 2+ and equal bilateral in the upper and lower extremities. No pitting edema. No varicosities, clubbing, or cyanosis.  Pulm: CTA bilaterally. No expiratory muscle use while breathing.  GI: +BS. NTND. No rigidity or guarding. No rebound tenderness.  MSK: No ttp about the R shoulder. No midline ttp. 5/5 strength about the R shoulder. Full AROM/PROM of the R shoulder.  Neuro: CN 2-12 grossly intact.  Psych: A&O x 4. Mood and affect appropriate for situation.  Skin: Warm and dry. No rashes or excoriations on exposed skin.  A&P:  1. Mid back pain on right side- Improving overall. Will return to work with restrictions.   2. Lumbar strain, initial encounter - x1 day. Most likely associated with previous injury.  Plan: methocarbamol (ROBAXIN) 500 MG tablet  3. Work related injury - Mr. Lamartina can return to work with the following restrictions:  no lifting >40 lbs, not  overhead work >15 lbs beginning next scheduled shift (03/24/2017). Plan: methocarbamol (ROBAXIN) 500 MG tablet  Will return on 03/26/17 for reevaluation.        Respectfully,  Camillia Herter, PA-S2

## 2017-03-26 ENCOUNTER — Ambulatory Visit (INDEPENDENT_AMBULATORY_CARE_PROVIDER_SITE_OTHER): Payer: Worker's Compensation | Admitting: Physician Assistant

## 2017-03-26 VITALS — BP 101/69 | HR 72 | Temp 97.7°F | Resp 18 | Ht 61.0 in | Wt 187.0 lb

## 2017-03-26 DIAGNOSIS — M549 Dorsalgia, unspecified: Secondary | ICD-10-CM

## 2017-03-26 DIAGNOSIS — Y990 Civilian activity done for income or pay: Secondary | ICD-10-CM

## 2017-03-26 NOTE — Progress Notes (Signed)
Ernest Preston 05/05/1991 26 y.o.   Chief Complaint  Patient presents with  . Follow-up    Mid back pain on right side    Presents for evaluation of work-related complaint.  Date of Injury: 03/04/2017  History of Present Illness: Patient presents for re-evaluation of mid back pain on the RIGHT side.  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 He presented here for evaluation. Radiographs were normal. He was prescribed naproxen, acetaminophen and methocarbamol, and advised to stay out of work and return today for re-evaluation.  At follow-up on 03/07/17 he was improving. Pain at rest was rated0/10, 3/10 with movement. He returned to work 03/10/17 as planned, feeling that he was ready, but as soon as he starting doing the overhead reaching required for his job, the pain recurred. He reported 7/10 at work, 4/10 at rest. He told me that light duty was not an option, so I wrote him out of work, only to receive communication from his employer that light duty was indeed available. He was released to return to work with no lifting >5 lbs, no overhead activities.  On 03/14/2017 he was improving, and pain was intermittent, 2/10. He felt somewhat stiff, which he related to reduced activity. He was able to do his work with the restrictions without pain. On exam, he had mild tenderness of the RIGHT upper back along the medial aspect of the scapula. Work was advanced to allow lifting up to 25 lbs, and overhead work up to 5 lbs. He was advised to continue the prescribed NSAID and muscle relaxer and return today in anticipation of continued advancing of restrictions.  On 03/22/2017 he reported continued improvement. The previous day, while lifting a plastic crate/mold and passing it from one hand to the other, he developed pain in the RIGHT shoulder and numbness/tingling down the RIGHT arm. The pain and  paresthesias were intermittent for 60-90 minutes and then recurred at rest later that evening at home, lasting about 30 minutes. Medications were refilled and his activity was advanced again, with plan to RTC today and anticipated return to full duty.  Today he reports doing well, tolerating the advanced work restrictions. Continues to have stiffness, but no pain. No radicular symptoms, no weakness. Not doing any stretching, but is doing some shoulder and back ROM. Feels ready to RTW full duty.   ROS As above.    Current medications and allergies reviewed and updated. Past medical history, family history, social history have been reviewed and updated.   Physical Exam  Constitutional: He is oriented to person, place, and time and well-developed, well-nourished, and in no distress.  BP 101/69   Pulse 72   Temp 97.7 F (36.5 C) (Oral)   Resp 18   Ht  (1.549 m)   Wt 187 lb (84.8 kg)   SpO2 97%   BMI 35.33 kg/m    Eyes: Conjunctivae are normal.  Pulmonary/Chest: Effort normal.  Musculoskeletal:       Cervical back: He exhibits normal range of motion, no tenderness, no bony tenderness, no swelling, no pain and no spasm.       Thoracic back: He exhibits normal range of motion, no tenderness, no bony tenderness, no swelling, no edema, no pain and no spasm.       Back:  Neurological: He is alert and oriented to person, place, and time. Gait normal.  Skin: Skin is warm and dry.  Psychiatric: Mood, memory, affect and judgment normal.     Assessment and Plan: 1. Mid back pain on right side 2. Work related injury RTW without restrictions. Continue methocarbamol and naproxen as needed.    Return if symptoms worsen or fail to improve.   Fernande Bras, PA-C [facility]

## 2017-03-26 NOTE — Patient Instructions (Addendum)
You may continue to use the methocarbamol and naproxen AS NEEDED. Continue the range of motion exercises. Be careful not to overdue your activities at home, to allow your muscles to rest and recover from the increased work at your job.     IF you received an x-ray today, you will receive an invoice from [facility]. Please contact [facility] at [phone] with questions or concerns regarding your invoice.   IF you received labwork today, you will receive an invoice from [facility]. Please contact [facility] at [phone] with questions or concerns regarding your invoice.   Our billing staff will not be able to assist you with questions regarding bills from these companies.  You will be contacted with the lab results as soon as they are available. The fastest way to get your results is to activate your My Chart account. Instructions are located on the last page of this paperwork. If you have not heard from [place] regarding the results in 2 weeks, please contact this office.

## 2017-03-26 NOTE — Progress Notes (Signed)
THIS NOTE IS USED FOR EDUCATIONAL PURPOSES ONLY!!!   Name: Ernest Preston  DOB: 05-Jul-1991  Age: 26 y.o. Sex: male  CC:  Chief Complaint  Patient presents with  . 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   Patient reports he is doing well. No pain. Reports he is handling the restrictions well that we gave him last time. He reports he just has some "stiffness". He reports he has to work today.   He reports he is going "stretches" at home. Which patient describes as mostly ROM movements of the shoulder.   Patient reports he feels like he is able to go back to full duty.   Currently taking muscle relaxer's as prescribed.   ROS:  Constitutional: Negative for activity change, appetite change, fatigue and unexpected weight change.  HENT: Negative for congestion, dental problem, ear pain, hearing loss, mouth sores, postnasal drip, rhinorrhea, sneezing, sore throat, tinnitus and trouble swallowing.   Eyes: Negative for photophobia, pain, redness and visual disturbance.  Respiratory: Negative for cough, chest tightness and shortness of breath.   Cardiovascular: Negative for chest pain, palpitations and leg swelling.  Gastrointestinal: Negative for abdominal pain, blood in stool, constipation, diarrhea, nausea and vomiting.  Genitourinary: Negative for dysuria, frequency, hematuria and urgency.  Musculoskeletal: Negative for arthralgias, gait problem, myalgias. Skin: Negative for rash.  Neurological: Negative for dizziness, speech difficulty, weakness, light-headedness, numbness and headaches.  Hematological: Negative for adenopathy.  Psychiatric/Behavioral: Negative for confusion and sleep disturbance. The patient is not nervous/anxious.    PMH, FHx, Medications, Allergies were all addressed as needed today.   PE:  GS: WDWN male sitting on exam table in NAD.  Vitals: BP 101/69   Pulse 72    Temp 97.7 F (36.5 C) (Oral)   Resp 18   Ht  (1.549 m)   Wt 187 lb (84.8 kg)   SpO2 97%   BMI 35.33 kg/m  HEENT: Normocephalic, atruamatic. PEARRL. No cervical lymphadenopathy. No thyroid nodules, normal size, and equal bilaterally. Ears: Bilateral ears without erythema, TM clear with good coen of light. TM without bulging. Nose: Patent, without erythema or discharge. Mouth/Throat: Moist mucous membranes. No erythema. Tonsils without erythema or tonsillar exudate.  Cardiovascular: RRR. No S3 or S4. No murmurs, rubs, or gallops. Pulses 2+ and equal bilateral in the upper and lower extremities. No pitting edema. No varicosities, clubbing, or cyanosis.  Pulm: CTA bilaterally. No expiratory muscle use while breathing.  GI: +BS. NTND. No rigidity or guarding. No rebound tenderness.  MSK: No ttp throughout the right shoulder. 5/5 strength of shoulder. Sensation intact. Full AROM and PROM.  Neuro: CN 2-12 grossly intact.  Psych: A&O x 4. Mood and affect appropriate for situation.  Skin: Warm and dry. No rashes or excoriations on exposed skin.   A&P:  1. Mid back pain on right side- continuing to improve. Reports associated stiffness. No pain. Will return to full duty today at work.  -Education: Encouraged to use a warm heat compress to relieve the stiffness tension. Use methocarbamol PRN and naproxen PRN instead of scheduled.   2. Work related injury  F/u PRN.       Respectfully,  Camillia Herter, PA-S2

## 2018-08-19 IMAGING — DX DG THORACIC SPINE 2V
3 series · 3 of 3 positions shown · non-contrast
Comparison: None.

CLINICAL DATA: Upper and mid thoracic back injury and pain. Initial
encounter.

EXAM:
THORACIC SPINE 2 VIEWS

[t-spine ap]
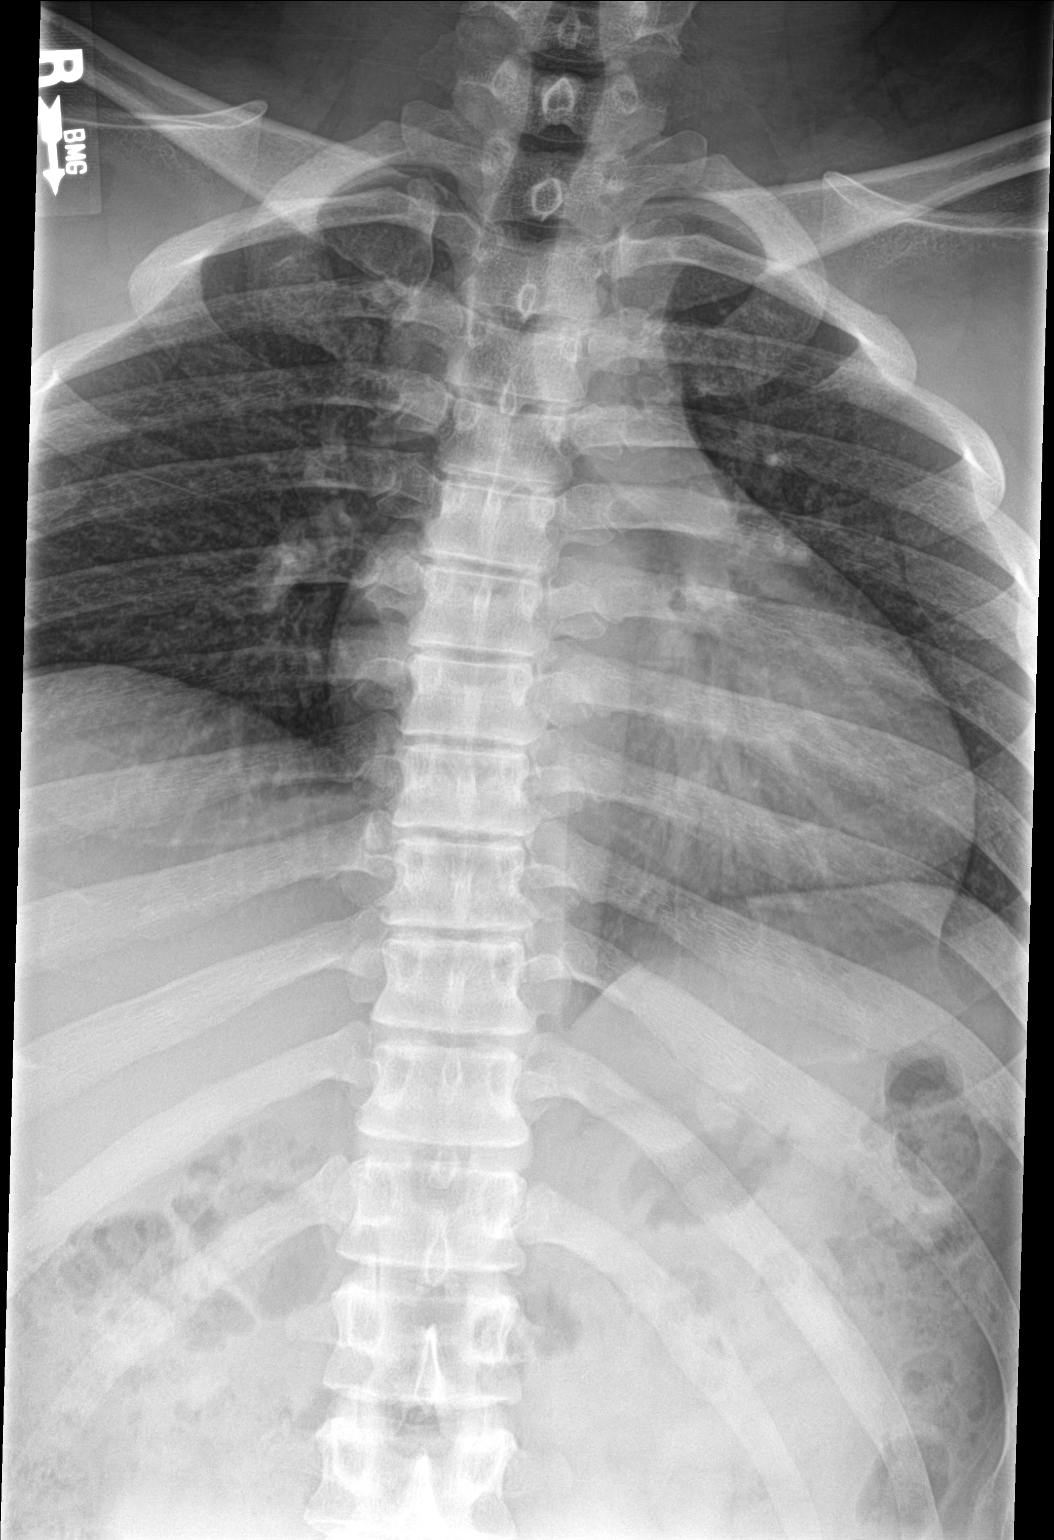

[t-spine lat (1 of 2)]
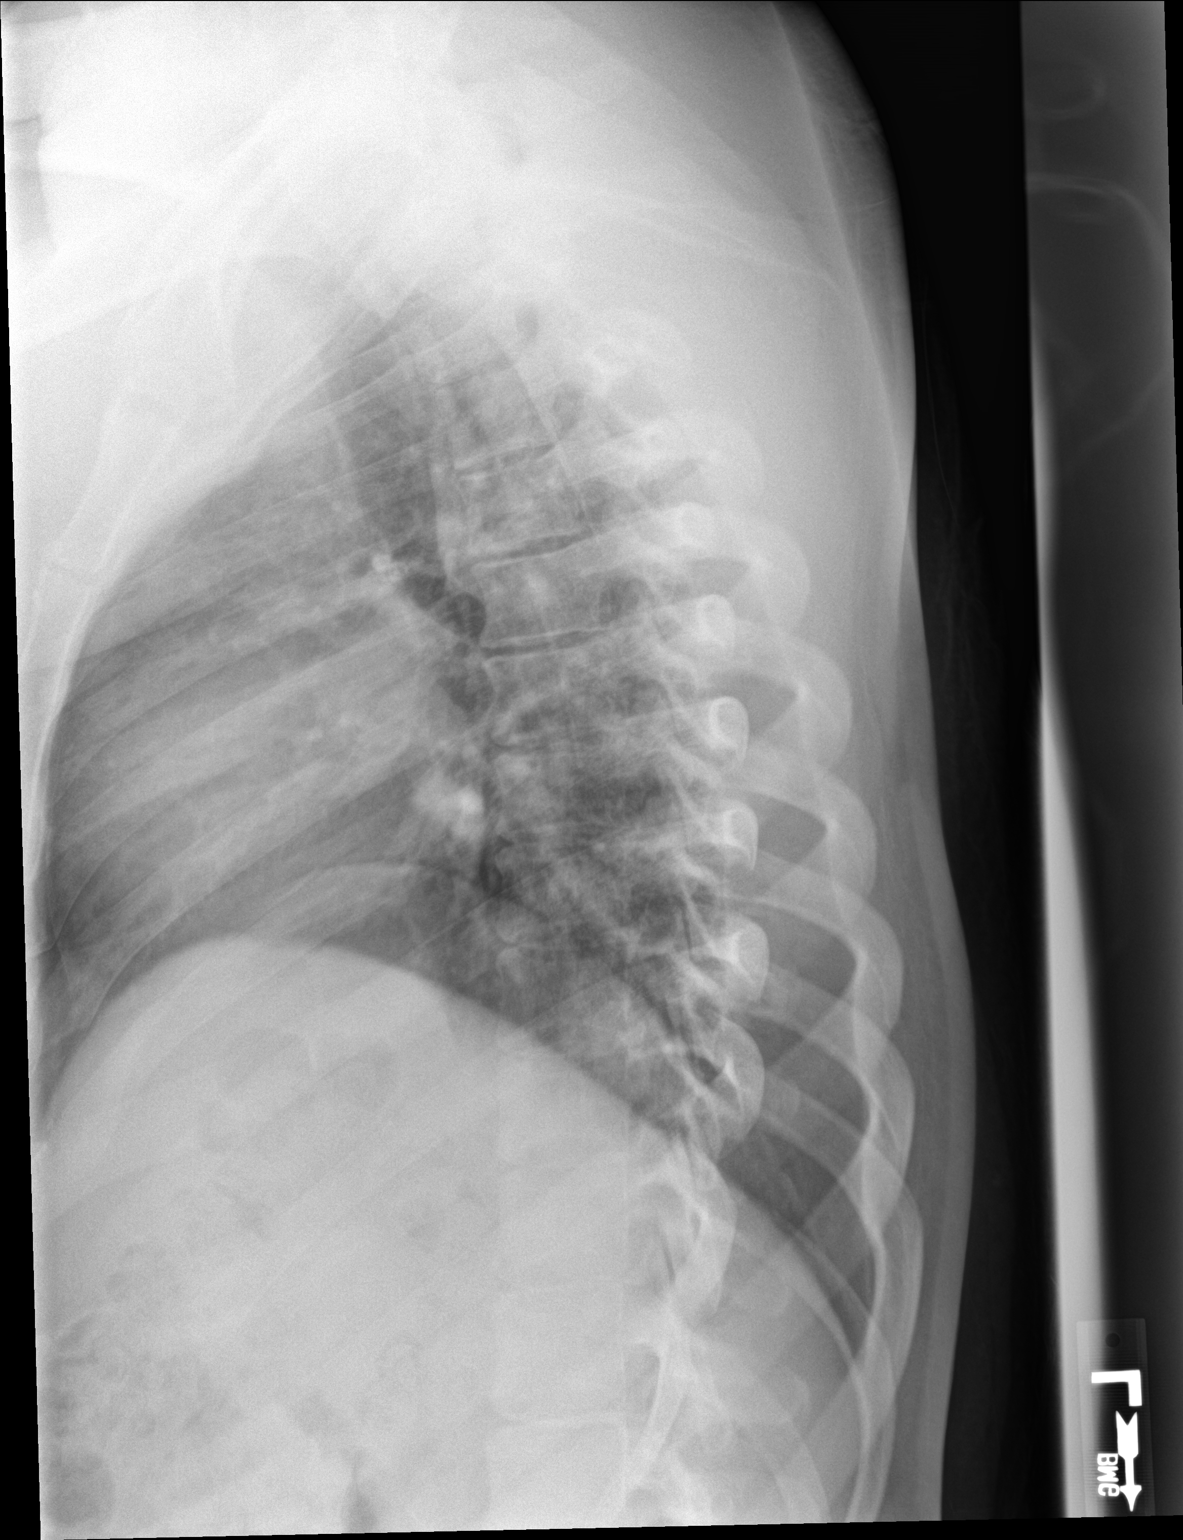

[t-spine lat (2 of 2)]
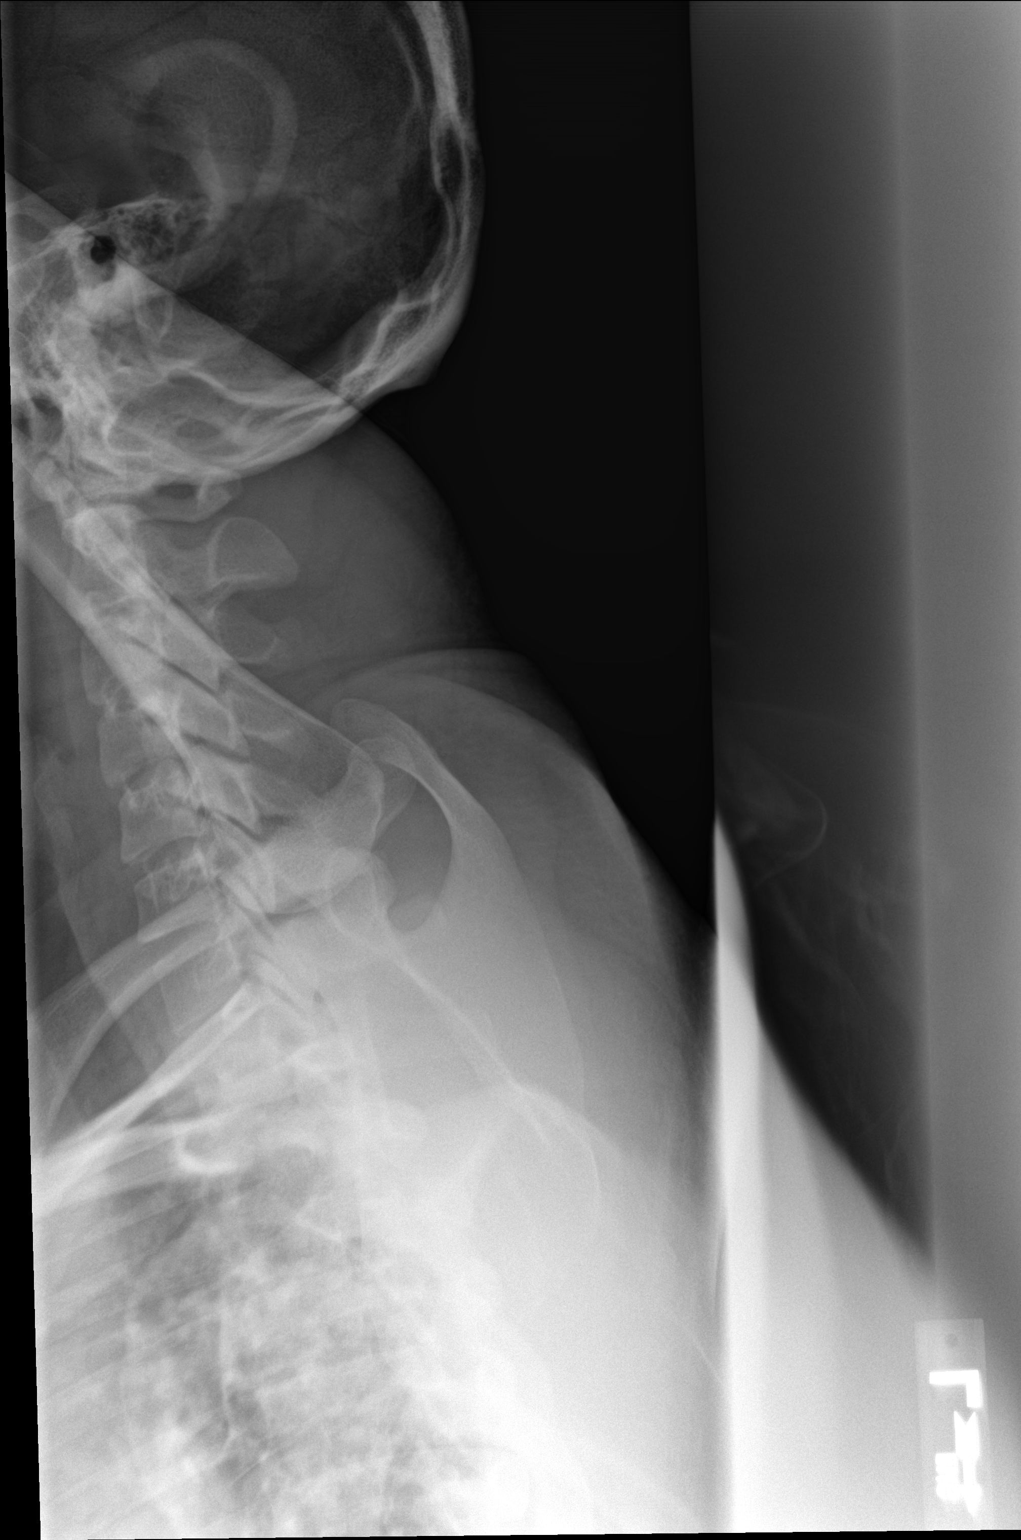

[3 of 3 positions shown; findings below may reference images not displayed]

FINDINGS: There is no evidence of thoracic spine fracture. Alignment is
normal. Intervertebral disc spaces are maintained. No other
significant bone abnormalities are identified.
IMPRESSION: Negative.

## 2021-03-20 ENCOUNTER — Ambulatory Visit
Admission: EM | Admit: 2021-03-20 | Discharge: 2021-03-20 | Disposition: A | Discharge status: Home / Self Care | Payer: Commercial Managed Care - PPO | Attending: Family Medicine | Admitting: Family Medicine

## 2021-03-20 ENCOUNTER — Other Ambulatory Visit: Payer: Self-pay

## 2021-03-20 DIAGNOSIS — S67193A Crushing injury of left middle finger, initial encounter: Secondary | ICD-10-CM

## 2021-03-20 DIAGNOSIS — Z23 Encounter for immunization: Secondary | ICD-10-CM

## 2021-03-20 MED ORDER — TETANUS-DIPHTH-ACELL PERTUSSIS 5-2.5-18.5 LF-MCG/0.5 IM SUSY
0.5000 mL | PREFILLED_SYRINGE | Freq: Once | INTRAMUSCULAR | Status: AC
  Administered 2021-03-20: 0.5 mL via INTRAMUSCULAR

## 2021-03-20 NOTE — ED Triage Notes (Signed)
Pt states smashed his lt middle finger between a pair of pliers. Small lac noted to tip of finger, bleeding controlled.

## 2021-03-20 NOTE — ED Provider Notes (Signed)
[facility]    CSN: [id number] Arrival date & time: 03/20/21  1721      History   Chief Complaint Chief Complaint  Patient presents with  . Finger Injury    HPI Ernest Preston is a 30 y.o. male.   HPI  Patient presents today with a work-related injury.  Patient reports he inadvertently sustained a crush injury to his left middle finger when gripping pliers squeezed the palmar surface of the tip of his left finger.  He did notify his employer and attempted to go to occupational health however the office was closed.  He is uncertain of his Tdap status.  Bleeding is controlled.  History reviewed. No pertinent past medical history.  Patient Active Problem List   Diagnosis Date Noted  . 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 INNER EAR SURGERY Right        Home Medications    Prior to Admission medications   Not on File    Family History Family History  Problem Relation Age of Onset  . Diabetes Mother     Social History Social History   Tobacco Use  . Smoking status: Never Smoker  . Smokeless tobacco: Never Used  Substance Use Topics  . Alcohol use: Yes    Alcohol/week: 1.0 standard drink    Types: 1 Shots of liquor per week  . Drug use: No     Allergies   Patient has no known allergies.   Review of Systems Review of Systems Pertinent negatives listed in HPI  Physical Exam Triage Vital Signs ED Triage Vitals  Enc Vitals Group     BP 03/20/21 1819 121/82     Pulse Rate 03/20/21 1819 (!) 58     Resp 03/20/21 1819 20     Temp 03/20/21 1819 98.3 F (36.8 C)     Temp Source 03/20/21 1819 Oral     SpO2 03/20/21 1819 98 %     Weight --      Height --      Head Circumference --      Peak Flow --      Pain Score 03/20/21 1821 8     Pain Loc --      Pain Edu? --      Excl. in GC? --    No data found.  Updated Vital Signs BP 121/82 (BP Location: Left Arm)    Pulse (!) 58   Temp 98.3 F (36.8 C) (Oral)   Resp 20   SpO2 98%   Visual Acuity Right Eye Distance:   Left Eye Distance:   Bilateral Distance:    Right Eye Near:   Left Eye Near:    Bilateral Near:     Physical Exam General appearance: alert, well developed, well nourished, cooperative  Head: Normocephalic, without obvious abnormality, atraumatic Respiratory: Respirations even and unlabored, normal respiratory rate Heart:Rate and rhythm normal. No gallop or murmurs noted on exam  Extremities: left middle finger, superficial laceration tip off finger  Skin: Skin color, texture, turgor normal. No rashes seen  Psych: Appropriate mood and affect. Neurologic: GCS 15, normal coordination normal gait  UC Treatments / Results  Labs (all labs ordered are listed, but only abnormal results are displayed) Labs Reviewed - No data to display  EKG   Radiology No results found.  Procedures Procedures (including critical care time)  Medications Ordered in UC Medications  Tdap (BOOSTRIX) injection 0.5  mL (0.5 mLs Intramuscular Given 03/20/21 1828)    Initial Impression / Assessment and Plan / UC Course  I have reviewed the triage vital signs and the nursing notes.  Pertinent labs & imaging results that were available during my care of the patient were reviewed by me and considered in my medical decision making (see chart for details).    TDAP updated.Superficial laceration related to crush injury, no intervention warranted. Wound care provided and dressing applied.Change daily with wide bandage and reinforce with CoBan. Monitor for signs of infection, work related injury, follow-up with occupational health as needed. Final Clinical Impressions(s) / UC Diagnoses   Final diagnoses:  Crushing injury of left middle finger, initial encounter  Need for diphtheria-tetanus-pertussis (Tdap) vaccine   Discharge Instructions   None    ED Prescriptions    None     PDMP not  reviewed this encounter.   Bing Neighbors, [place] 03/27/21 [phone]

## 2023-03-04 ENCOUNTER — Emergency Department (HOSPITAL_COMMUNITY)
Admission: EM | Admit: 2023-03-04 | Discharge: 2023-03-05 | Disposition: A | Discharge status: Home / Self Care | Payer: 59 | Attending: Emergency Medicine | Admitting: Emergency Medicine

## 2023-03-04 ENCOUNTER — Other Ambulatory Visit: Payer: Self-pay

## 2023-03-04 ENCOUNTER — Encounter (HOSPITAL_COMMUNITY): Payer: Self-pay | Admitting: *Deleted

## 2023-03-04 DIAGNOSIS — W57XXXA Bitten or stung by nonvenomous insect and other nonvenomous arthropods, initial encounter: Secondary | ICD-10-CM | POA: Insufficient documentation

## 2023-03-04 DIAGNOSIS — S30863A Insect bite (nonvenomous) of scrotum and testes, initial encounter: Secondary | ICD-10-CM | POA: Diagnosis not present

## 2023-03-04 NOTE — ED Triage Notes (Signed)
Pt reports tick that he's noticed since yesterday. Denies pain, c/o itching to the site

## 2023-03-05 MED ORDER — DOXYCYCLINE HYCLATE 100 MG PO TABS
200.0000 mg | ORAL_TABLET | Freq: Once | ORAL | Status: AC
  Administered 2023-03-05: 200 mg via ORAL
  Filled 2023-03-05: qty 2

## 2023-03-05 NOTE — ED Provider Notes (Signed)
[place] [facility]MENT AT [facility] Provider Note   CSN: [id number] Arrival date & time: 03/04/23  2309     History  Chief Complaint  Patient presents with   Tick Removal    Ernest Preston is a 32 y.o. male.  The history is provided by the patient. No language interpreter was used.      32 year old male presenting requesting for tick removal.  Patient report while showering today he noticed some discomfort to his scrotum.  He found a tick embedded to his scrotum and despite attempting to remove it he was unsuccessful.  He felt that the tick may have been on the skin since yesterday when he was walking his dog through some tall grass.  He denies any fever headache joint pain abdominal cramping or any other symptoms.  He is not allergic to any medication.  He has not noticed any other tick.    Home Medications Prior to Admission medications   Not on File      Allergies    Patient has no known allergies.    Review of Systems   Review of Systems  All other systems reviewed and are negative.   Physical Exam Updated Vital Signs BP 123/74 (BP Location: Right Arm)   Pulse 67   Temp 98.5 F (36.9 C) (Oral)   Resp 18   SpO2 100%  Physical Exam Vitals and nursing note reviewed.  Constitutional:      General: He is not in acute distress.    Appearance: He is well-developed.  HENT:     Head: Atraumatic.  Eyes:     Conjunctiva/sclera: Conjunctivae normal.  Genitourinary:    Comments: Chaperone present during exam.  On skin exam, patient has 1 embedded tick on his scrotum.  The tick does appear mildly engorged.  There are no surrounding erythema.  No other tick noted. Musculoskeletal:     Cervical back: Neck supple.  Skin:    Findings: No rash.  Neurological:     Mental Status: He is alert.     ED Results / Procedures / Treatments   Labs (all labs ordered are listed, but only abnormal results are displayed) Labs Reviewed - No data to  display  EKG None  Radiology No results found.  Procedures .Foreign Body Removal  Date/Time: 03/05/2023 12:31 AM  Performed by: Domenic Moras, PA-C Authorized by: Domenic Moras, PA-C  Consent: Verbal consent obtained. Risks and benefits: risks, benefits and alternatives were discussed Consent given by: patient Body area: skin General location: trunk Location details: scrotal wall Anesthesia method: pain ease spray.  Sedation: Patient sedated: no  Patient restrained: no Patient cooperative: yes Localization method: visualized Removal mechanism: forceps Complexity: simple 1 objects recovered. Objects recovered: tick Post-procedure assessment: foreign body removed Patient tolerance: patient tolerated the procedure well with no immediate complications      Medications Ordered in ED Medications  doxycycline (VIBRA-TABS) tablet 200 mg (has no administration in time range)    ED Course/ Medical Decision Making/ A&P                             Medical Decision Making  BP 123/74 (BP Location: Right Arm)   Pulse 67   Temp 98.5 F (36.9 C) (Oral)   Resp 18   SpO2 100%   52:17 AM   32 year old male presenting requesting for tick removal.  Patient report while showering today he noticed some discomfort to  his scrotum.  He found a tick embedded to his scrotum and despite attempting to remove it he was unsuccessful.  He felt that the tick may have been on the skin since yesterday when he was walking his dog through some tall grass.  He denies any fever headache joint pain abdominal cramping or any other symptoms.  He is not allergic to any medication.  He has not noticed any other tick.    On exam with chaperone present patient does have an embedded tick on his scrotum.  I have examined the rest of his skin and did not notice any other embedded tick.  I was able to successfully remove the tick bite by using pain ease spray, and the tick was removed using a forcep.  Patient  tolerates well.  No concerning rash noted.  Due to potential infection from tickborne illness such as Lyme disease, [place] spotted fever, patient was giving a prophylactic antibiotic doxycycline 200 mg p.o. once.  I gave patient return precaution if develop any infectious symptoms.  Patient report feeling better after tick removal.        Final Clinical Impression(s) / ED Diagnoses Final diagnoses:  Tick bite with subsequent removal of tick    Rx / DC Orders ED Discharge Orders     None         Domenic Moras, PA-C 03/05/23 0034    Quintella Reichert, MD 03/05/23 [phone]

## 2024-04-16 ENCOUNTER — Encounter: Payer: Self-pay | Admitting: Emergency Medicine

## 2024-04-16 ENCOUNTER — Ambulatory Visit: Admission: EM | Admit: 2024-04-16 | Discharge: 2024-04-16 | Disposition: A | Discharge status: Home / Self Care

## 2024-04-16 DIAGNOSIS — J0110 Acute frontal sinusitis, unspecified: Secondary | ICD-10-CM

## 2024-04-16 MED ORDER — AMOXICILLIN 875 MG PO TABS: 875.0000 mg | ORAL_TABLET | Freq: Two times a day (BID) | ORAL | 0 refills | Status: DC

## 2024-04-16 MED ORDER — IPRATROPIUM BROMIDE 0.03 % NA SOLN: 2.0000 | Freq: Three times a day (TID) | NASAL | 0 refills | Status: AC | PRN

## 2024-04-16 MED ORDER — PREDNISONE 20 MG PO TABS: 20.0000 mg | ORAL_TABLET | Freq: Every day | ORAL | 0 refills | Status: DC

## 2024-04-16 NOTE — ED Provider Notes (Signed)
 [facility]    CSN: [id number] Arrival date & time: 04/16/24  1639      History   Chief Complaint Chief Complaint  Patient presents with   Nasal Congestion    HPI Ki Corbo is a 33 y.o. male.   HPI Patient here with a 2 to 3-week history of worsening nasal congestion and sinus pressure.  Per triage note: Pt here for persistent nasal congestion and runny nose x2 weeks. Has tried mucinex sinus-max nasal spray, allegra, and zyrtec (allergy meds not taken on a continuous basis) with no relief. Denies fevers, chill, sore throat, cough, and itchy/watery eyes.  Patient denies being productive coughing, fever, shortness of breath, chest tightness or wheezing.  Reports a history of nasal symptoms related to outdoor allergens.  No relief with attempted therapies.  History reviewed. No pertinent past medical history.  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 Authorizing Provider  amoxicillin  (AMOXIL ) 875 MG tablet Take 1 tablet (875 mg total) by mouth 2 (two) times daily. 04/16/24  Yes Buena Carmine, NP  cyclobenzaprine (FLEXERIL) 10 MG tablet Take 10 mg by mouth. Patient not taking: Reported on 04/16/2024 07/19/19   [provider]  ibuprofen (ADVIL) 800 MG tablet Take 800 mg by mouth. 02/14/22   [provider]  ipratropium (ATROVENT ) 0.03 % nasal spray Place 2 sprays into both nostrils 3 (three) times daily as needed for rhinitis. 04/16/24  Yes Buena Carmine, NP  predniSONE  (DELTASONE ) 20 MG tablet Take 1 tablet (20 mg total) by mouth daily with breakfast. 04/16/24  Yes Buena Carmine, NP    Family History Family History  Problem Relation Age of Onset   Diabetes Mother     Social  History Social History   Tobacco Use   Smoking status: Never   Smokeless tobacco: Never  Vaping Use   Vaping status: Never Used  Substance Use Topics   Alcohol  use: Yes    Alcohol /week: 1.0 standard drink of alcohol     Types: 1 Shots of liquor per week   Drug use: Not Currently    Types: Marijuana     Allergies   Patient has no known allergies.   Review of Systems Review of Systems Pertinent negatives listed in HPI   Physical Exam Triage Vital Signs ED Triage Vitals [04/16/24 1757]  Encounter Vitals Group     BP 111/76     Systolic BP Percentile      Diastolic BP Percentile      Pulse Rate 74     Resp 18     Temp 97.6 F (36.4 C)     Temp Source Oral     SpO2 97 %     Weight      Height      Head Circumference      Peak Flow      Pain Score 0     Pain Loc      Pain Education      Exclude from Growth Chart    No data found.  Updated Vital Signs BP 111/76 (BP Location: Left Arm)   Pulse 74   Temp 97.6 F (36.4 C) (Oral)   Resp 18  SpO2 97%   Visual Acuity Right Eye Distance:   Left Eye Distance:   Bilateral Distance:    Right Eye Near:   Left Eye Near:    Bilateral Near:     Physical Exam Vitals reviewed.  HENT:     Head: Normocephalic and atraumatic.     Jaw: Pain on movement present.     Right Ear: Tympanic membrane, ear canal and external ear normal. There is no impacted cerumen.     Left Ear: Tympanic membrane, ear canal and external ear normal. There is no impacted cerumen.     Nose: Mucosal edema, congestion and rhinorrhea present. Rhinorrhea is purulent.     Right Sinus: Frontal sinus tenderness present.     Left Sinus: Frontal sinus tenderness present.     Mouth/Throat:     Pharynx: Uvula midline. Postnasal drip present.     Tonsils: 0 on the right. 0 on the left.  Eyes:     Extraocular Movements: Extraocular movements intact.     Pupils: Pupils are equal, round, and reactive to light.  Cardiovascular:     Rate and Rhythm:  Normal rate and regular rhythm.  Pulmonary:     Effort: Pulmonary effort is normal.     Breath sounds: Normal breath sounds.  Musculoskeletal:     Cervical back: Normal range of motion.  Lymphadenopathy:     Cervical: No cervical adenopathy.  Neurological:     General: No focal deficit present.     Mental Status: He is alert and oriented to person, place, and time.      UC Treatments / Results  Labs (all labs ordered are listed, but only abnormal results are displayed) Labs Reviewed - No data to display  EKG   Radiology No results found.  Procedures Procedures (including critical care time)  Medications Ordered in UC Medications - No data to display  Initial Impression / Assessment and Plan / UC Course  I have reviewed the triage vital signs and the nursing notes.  Pertinent labs & imaging results that were available during my care of the patient were reviewed by me and considered in my medical decision making (see chart for details).    Patient's symptoms are consistent with acute sinusitis given the length of time symptoms have been present and waxing and waning and at this point poorly responding to consistent OTC antihistamine treatment.  Will treat empirically for sinusitis with amoxicillin  875 twice daily for 10 days.  For progressively worsening sinus pressure and sinus headache prescribed prednisone  20 mg daily for 5 days.  Patient advised to continue  antihistamine therapy.  Also prescribed Atrovent  nasal spray to help with postnasal drainage and recurrent rhinorrhea.  Patient has a follow-up PCP if symptoms worsen or do not improve.  Return precautions for here also given.  Patient verbalized understanding and agreement with plan today. Final Clinical Impressions(s) / UC Diagnoses   Final diagnoses:  Acute non-recurrent frontal sinusitis   Discharge Instructions   None    ED Prescriptions     Medication Sig Dispense Auth. Provider   amoxicillin  (AMOXIL )  875 MG tablet Take 1 tablet (875 mg total) by mouth 2 (two) times daily. 20 tablet Buena Carmine, NP   predniSONE  (DELTASONE ) 20 MG tablet Take 1 tablet (20 mg total) by mouth daily with breakfast. 10 tablet Buena Carmine, NP   ipratropium (ATROVENT ) 0.03 % nasal spray Place 2 sprays into both nostrils 3 (three) times daily as needed for rhinitis.  30 mL Buena Carmine, NP      PDMP not reviewed this encounter.   Buena Carmine, NP 04/18/24 [phone]

## 2024-04-16 NOTE — ED Triage Notes (Signed)
 Pt here for persistent nasal congestion and runny nose x2 weeks. Has tried mucinex sinus-max nasal spray, allegra, and zyrtec (allergy meds not taken on a continuous basis) with no relief. Denies fevers, chill, sore throat, cough, and itchy/watery eyes.

## 2024-06-30 ENCOUNTER — Ambulatory Visit (INDEPENDENT_AMBULATORY_CARE_PROVIDER_SITE_OTHER)

## 2024-06-30 ENCOUNTER — Ambulatory Visit

## 2024-06-30 ENCOUNTER — Ambulatory Visit
Admission: EM | Admit: 2024-06-30 | Discharge: 2024-06-30 | Disposition: A | Discharge status: Home / Self Care | Attending: Physician Assistant | Admitting: Physician Assistant

## 2024-06-30 ENCOUNTER — Encounter: Payer: Self-pay | Admitting: Emergency Medicine

## 2024-06-30 DIAGNOSIS — M79672 Pain in left foot: Secondary | ICD-10-CM

## 2024-06-30 MED ORDER — PREDNISONE 20 MG PO TABS
40.0000 mg | ORAL_TABLET | Freq: Every day | ORAL | 0 refills | Status: AC
Start: 2024-06-30 — End: 2024-07-05

## 2024-06-30 NOTE — Discharge Instructions (Signed)
  Please follow up with ortho if no gradual improvement or with any further concerns.

## 2024-06-30 NOTE — ED Triage Notes (Signed)
 Pt presents c/o left foot pain x 2 days. 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 Pt says this is the first time he has experienced this. Pt denies injury and/or fall.

## 2024-07-02 NOTE — ED Provider Notes (Signed)
 [facility]    CSN: [id number] Arrival date & time: 06/30/24  1441      History   Chief Complaint Chief Complaint  Patient presents with   Foot Pain    HPI Ernest Preston is a 33 y.o. male.   Patient presents today for evaluation of left foot pain that started 2 days ago.  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  He notes some most pain at the back of his ankle as well.  He denies any injury.  He does not report numbness or tingling.  The history is provided by the patient.  Foot Pain Pertinent negatives include no shortness of breath.    History reviewed. No pertinent past medical history.  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 Authorizing Provider  predniSONE  (DELTASONE ) 20 MG tablet Take 2 tablets (40 mg total) by mouth daily with breakfast for 5 days. 06/30/24 07/05/24 Yes Billy Asberry FALCON, PA-C  amoxicillin  (AMOXIL ) 875 MG tablet Take 1 tablet (875 mg total) by mouth 2 (two) times daily. 04/16/24   Arloa Suzen RAMAN, NP  cyclobenzaprine (FLEXERIL) 10 MG tablet Take 10 mg by mouth. Patient not taking: Reported on 04/16/2024 07/19/19   [provider]  ibuprofen (ADVIL) 800 MG tablet Take 800 mg by mouth. 02/14/22   [provider]  ipratropium (ATROVENT ) 0.03 % nasal spray Place 2 sprays into both nostrils 3 (three) times daily as needed for rhinitis. 04/16/24   Arloa Suzen RAMAN, NP    Family History Family History  Problem Relation Age of Onset   Diabetes Mother     Social History Social History   Tobacco Use   Smoking status: Never    Passive exposure: Never    Smokeless tobacco: Never  Vaping Use   Vaping status: Never Used  Substance Use Topics   Alcohol  use: Yes    Alcohol /week: 1.0 standard drink of alcohol     Types: 1 Shots of liquor per week   Drug use: Not Currently    Types: Marijuana     Allergies   Patient has no known allergies.   Review of Systems Review of Systems  Constitutional:  Negative for chills and fever.  Eyes:  Negative for discharge and redness.  Respiratory:  Negative for shortness of breath.   Musculoskeletal:  Positive for arthralgias.  Neurological:  Negative for numbness.     Physical Exam Triage Vital Signs ED Triage Vitals  Encounter Vitals Group     BP 06/30/24 1500 121/79     Girls Systolic BP Percentile --      Girls Diastolic BP Percentile --      Boys Systolic BP Percentile --      Boys Diastolic BP Percentile --      Pulse Rate 06/30/24 1500 64     Resp 06/30/24 1500 18     Temp 06/30/24 1500 97.8 F (36.6 C)     Temp Source 06/30/24 1500 Oral     SpO2 06/30/24 1500 98 %     Weight 06/30/24  1459 175 lb (79.4 kg)     Height --      Head Circumference --      Peak Flow --      Pain Score 06/30/24 1458 2     Pain Loc --      Pain Education --      Exclude from Growth Chart --    No data found.  Updated Vital Signs BP 121/79 (BP Location: Right Arm)   Pulse 64   Temp 97.8 F (36.6 C) (Oral)   Resp 18   Wt 175 lb (79.4 kg)   SpO2 98%   BMI 33.07 kg/m   Visual Acuity Right Eye Distance:   Left Eye Distance:   Bilateral Distance:    Right Eye Near:   Left Eye Near:    Bilateral Near:     Physical Exam Vitals and nursing note reviewed.  Constitutional:      General: He is not in acute distress.    Appearance: Normal appearance. He is not ill-appearing.  HENT:     Head: Normocephalic and atraumatic.  Pulmonary:     Effort: Pulmonary effort is normal. No respiratory distress.  Musculoskeletal:     Comments: Relatively normal range of motion of left ankle, foot and  toes.  Mild tenderness appreciated to posterior medial and lateral malleolus.  Patient points to pain at the sole of his foot at his heel where he is tender as well.  No significant swelling or color changes to left foot or ankle  Neurological:     Mental Status: He is alert.     Comments: Gross sensation intact to the left toes distally      UC Treatments / Results  Labs (all labs ordered are listed, but only abnormal results are displayed) Labs Reviewed - No data to display  EKG   Radiology DG Foot Complete Left Result Date: 06/30/2024 CLINICAL DATA:  Injury. EXAM: LEFT FOOT - COMPLETE 3+ VIEW COMPARISON:  None Available. FINDINGS: No acute fracture or dislocation. No aggressive osseous lesion. Mild diffuse arthritis of imaged joints. Tiny calcaneal spur noted along the Achilles tendon and Plantar aponeurosis attachment sites. No focal soft tissue swelling. No radiopaque foreign bodies. IMPRESSION: No acute osseous abnormality of the left foot. Electronically Signed   By: Ree Molt M.D.   On: 06/30/2024 15:47    Procedures Procedures (including critical care time)  Medications Ordered in UC Medications - No data to display  Initial Impression / Assessment and Plan / UC Course  I have reviewed the triage vital signs and the nursing notes.  Pertinent labs & imaging results that were available during my care of the patient were reviewed by me and considered in my medical decision making (see chart for details).    X-ray ordered to rule out stress fracture, suspect most likely plantar fasciitis and possibly other inflammatory cause of ankle pain.  Will treat with steroid burst and advised follow-up with Ortho if no gradual improvement with any worsening symptoms.  Final Clinical Impressions(s) / UC Diagnoses   Final diagnoses:  Left foot pain     Discharge Instructions       Please follow up with ortho if no gradual improvement or with any further concerns.     ED  Prescriptions     Medication Sig Dispense Auth. Provider   predniSONE  (DELTASONE ) 20 MG tablet Take 2 tablets (40 mg total) by mouth daily with breakfast for 5 days. 10 tablet Billy Asberry FALCON,  PA-C      PDMP not reviewed this encounter.   Billy Asberry FALCON, PA-C 07/02/24 1147

## 2024-09-09 ENCOUNTER — Ambulatory Visit
Admission: RE | Admit: 2024-09-09 | Discharge: 2024-09-09 | Disposition: A | Discharge status: Home / Self Care | Source: Ambulatory Visit | Attending: Family Medicine | Admitting: Family Medicine

## 2024-09-09 VITALS — BP 110/70 | HR 67 | Temp 97.6°F | Resp 18 | Wt 175.0 lb

## 2024-09-09 DIAGNOSIS — J0100 Acute maxillary sinusitis, unspecified: Secondary | ICD-10-CM | POA: Diagnosis not present

## 2024-09-09 MED ORDER — AMOXICILLIN-POT CLAVULANATE 875-125 MG PO TABS: 1.0000 | ORAL_TABLET | Freq: Two times a day (BID) | ORAL | 0 refills | Status: AC

## 2024-09-09 MED ORDER — PREDNISONE 20 MG PO TABS: 40.0000 mg | ORAL_TABLET | Freq: Every day | ORAL | 0 refills | Status: DC

## 2024-09-09 NOTE — ED Triage Notes (Signed)
 Pt presents c/o nasal congestion since August. Pt reports trying Mucinex nasal spray to help with congestion but sxs did not improve. Pt denies any additional sxs but does report that he can smell iron since August.

## 2024-09-09 NOTE — ED Provider Notes (Signed)
 [facility]   [id number] 09/09/24 Arrival Time: 9066  ASSESSMENT & PLAN:  1. Acute non-recurrent maxillary sinusitis    Begin: Meds ordered this encounter  Medications   amoxicillin -clavulanate (AUGMENTIN) 875-125 MG tablet    Sig: Take 1 tablet by mouth every 12 (twelve) hours for 7 days.    Dispense:  14 tablet    Refill:  0   predniSONE  (DELTASONE ) 20 MG tablet    Sig: Take 2 tablets (40 mg total) by mouth daily.    Dispense:  10 tablet    Refill:  0   OTC symptom care as needed. Ensure adequate fluid intake and rest.   Follow-up Information     [facility]   Specialty: Urgent Care Why: If worsening or failing to improve as anticipated. Contact information: [address] [place]  [address] [phone]                Reviewed expectations re: course of current medical issues. Questions answered. Outlined signs and symptoms indicating need for more acute intervention. Patient verbalized understanding. After Visit Summary given.   SUBJECTIVE: History from: patient.  Ernest Preston is a 33 y.o. male who presents with complaint of nasal congestion, post-nasal drainage, and sinus pain. Onset gradual, 1-2 mos ago. Respiratory symptoms: none. Fever: absent. Overall normal PO intake without n/v. OTC treatment: Mucinex nasal spray with some relief. Social History   Tobacco Use  Smoking Status Never   Passive exposure: Never  Smokeless Tobacco Never    OBJECTIVE:  Vitals:   09/09/24 1023 09/09/24 1024  BP:  110/70  Pulse:  67  Resp:  18  Temp:  97.6 F (36.4 C)  TempSrc:  Oral  SpO2:  97%  Weight: 79.4 kg      General appearance: alert; no distress HEENT: nasal congestion; clear runny nose; throat irritation secondary to post-nasal drainage; bilateral maxillary tenderness to palpation; turbinates boggy Neck: supple without LAD; trachea midline Lungs: unlabored  respirations, symmetrical air entry; cough: absent; no respiratory distress Skin: warm and dry Psychological: alert and cooperative; normal mood and affect  No Known Allergies  History reviewed. No pertinent past medical history. Family History  Problem Relation Age of Onset   Diabetes Mother    Social History   Socioeconomic History   Marital status: Single    Spouse name: Not on file   Number of children: 0   Years of education: 12th grade   Highest education level: Not on file  Occupational History   Occupation: door sanding  Tobacco Use   Smoking status: Never    Passive exposure: Never   Smokeless tobacco: Never  Vaping Use   Vaping status: Never Used  Substance and Sexual Activity   Alcohol  use: Yes    Alcohol /week: 1.0 standard drink of alcohol     Types: 1 Shots of liquor per week   Drug use: Not Currently    Types: Marijuana   Sexual activity: Not Currently  Other Topics Concern   Not on file  Social History Narrative   Lives with his brother and mother.   Social Drivers of Corporate investment banker Strain: Not on file  Food Insecurity: Not on file  Transportation Needs: Not on file  Physical Activity: Not on file  Stress: Not on file  Social Connections: Unknown (04/10/2022)   Received from [facility]   Social Network    Social Network: Not on file  Intimate Partner  Violence: Unknown (03/06/2022)   Received from [facility]   HITS    Physically Hurt: Not on file    Insult or Talk Down To: Not on file    Threaten Physical Harm: Not on file    Scream or Curse: Not on file             Rolinda Rogue, MD 09/09/24 1405

## 2024-11-26 ENCOUNTER — Ambulatory Visit
Admission: RE | Admit: 2024-11-26 | Discharge: 2024-11-26 | Disposition: A | Discharge status: Home / Self Care | Source: Ambulatory Visit

## 2024-11-26 VITALS — BP 110/71 | HR 78 | Temp 97.7°F | Resp 18 | Wt 175.0 lb

## 2024-11-26 DIAGNOSIS — M5432 Sciatica, left side: Secondary | ICD-10-CM

## 2024-11-26 DIAGNOSIS — T485X5A Adverse effect of other anti-common-cold drugs, initial encounter: Secondary | ICD-10-CM | POA: Diagnosis not present

## 2024-11-26 DIAGNOSIS — R0981 Nasal congestion: Secondary | ICD-10-CM

## 2024-11-26 MED ORDER — PREDNISONE 50 MG PO TABS: ORAL_TABLET | ORAL | 0 refills | Status: AC

## 2024-11-26 MED ORDER — PSEUDOEPHEDRINE HCL 30 MG PO TABS: 30.0000 mg | ORAL_TABLET | ORAL | 0 refills | Status: AC | PRN

## 2024-11-26 MED ORDER — FLUTICASONE PROPIONATE 50 MCG/ACT NA SUSP: 1.0000 | Freq: Every day | NASAL | 0 refills | Status: AC

## 2024-11-26 NOTE — ED Provider Notes (Signed)
 " [facility]    CSN: [id number] Arrival date & time: 11/26/24  1036      History   Chief Complaint Chief Complaint  Patient presents with   Nasal Congestion   Leg Numbness    HPI Ernest Preston is a 33 y.o. male.   Pt presents today due to nasal congestion, sneezing, and clear nasal drainage since September/October. Pt shows me 4 bottles of nasal spray that were all Afrin. Pt states that he uses it first thing in the morning because that is the only thing that helps him breath. Pt states that he has also been using Mucinex.   Pt states that he also gets occasional left sided lower back pain that radiates down left leg and to left foot after sitting cross legged from some time. Pt states that pain is relieved when he stands up and walks around some.   The history is provided by the patient.    History reviewed. No pertinent past medical history.  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 Authorizing Provider  fluticasone  (FLONASE ) 50 MCG/ACT nasal spray Place 1 spray into both nostrils daily. 11/26/24  Yes Andra Krabbe C, PA-C  predniSONE  (DELTASONE ) 50 MG tablet Take 1 tab po daily for 5 days 11/26/24  Yes Andra Krabbe BROCKS, PA-C  pseudoephedrine  (SUDAFED) 30 MG tablet Take 1 tablet (30 mg total) by mouth every 4 (four) hours as needed for congestion. 11/26/24  Yes Andra Krabbe C, PA-C  ibuprofen (ADVIL) 800 MG tablet Take 800 mg by mouth. 02/14/22   [provider]  ipratropium (ATROVENT ) 0.03 % nasal spray Place 2 sprays into both nostrils 3 (three) times daily as needed for rhinitis. 04/16/24   Arloa Suzen RAMAN, NP    Family History Family History  Problem Relation Age of Onset    Diabetes Mother     Social History Social History[1]   Allergies   Patient has no known allergies.   Review of Systems Review of Systems   Physical Exam Triage Vital Signs ED Triage Vitals  Encounter Vitals Group     BP 11/26/24 1055 110/71     Girls Systolic BP Percentile --      Girls Diastolic BP Percentile --      Boys Systolic BP Percentile --      Boys Diastolic BP Percentile --      Pulse Rate 11/26/24 1055 67     Resp 11/26/24 1055 18     Temp 11/26/24 1055 97.7 F (36.5 C)     Temp Source 11/26/24 1055 Oral     SpO2 11/26/24 1055 97 %     Weight 11/26/24 1111 175 lb 0.7 oz (79.4 kg)     Height --      Head Circumference --      Peak Flow --      Pain Score 11/26/24 1111 0     Pain Loc --      Pain Education --      Exclude from Growth Chart --    No data found.  Updated Vital Signs BP 110/71 (BP Location: Left Arm)   Pulse 78   Temp 97.7 F (36.5 C) (Oral)   Resp  18   Wt 175 lb 0.7 oz (79.4 kg)   SpO2 97%   BMI 33.07 kg/m   Visual Acuity Right Eye Distance:   Left Eye Distance:   Bilateral Distance:    Right Eye Near:   Left Eye Near:    Bilateral Near:     Physical Exam Vitals and nursing note reviewed.  Constitutional:      General: He is not in acute distress.    Appearance: Normal appearance. He is not ill-appearing, toxic-appearing or diaphoretic.  HENT:     Nose: Congestion (markedly enlarged turbinates) present. No rhinorrhea.     Right Sinus: No maxillary sinus tenderness or frontal sinus tenderness.     Left Sinus: No maxillary sinus tenderness or frontal sinus tenderness.     Comments: No tenderness to palpation of upper lip Eyes:     General: No scleral icterus. Cardiovascular:     Rate and Rhythm: Normal rate and regular rhythm.     Heart sounds: Normal heart sounds.  Pulmonary:     Effort: Pulmonary effort is normal. No respiratory distress.     Breath sounds: Normal breath sounds. No wheezing or rhonchi.  Skin:     General: Skin is warm.  Neurological:     Mental Status: He is alert and oriented to person, place, and time.  Psychiatric:        Mood and Affect: Mood normal.        Behavior: Behavior normal.      UC Treatments / Results  Labs (all labs ordered are listed, but only abnormal results are displayed) Labs Reviewed - No data to display  EKG   Radiology No results found.  Procedures Procedures (including critical care time)  Medications Ordered in UC Medications - No data to display  Initial Impression / Assessment and Plan / UC Course  I have reviewed the triage vital signs and the nursing notes.  Pertinent labs & imaging results that were available during my care of the patient were reviewed by me and considered in my medical decision making (see chart for details).    Final Clinical Impressions(s) / UC Diagnoses   Final diagnoses:  Nasal congestion due to prolonged use of decongestants  Sciatica of left side     Discharge Instructions      Discontinue use of Afrin nasal sprays all of the nasal sprays you had in your pocket were Afrin or a generic form of Afrin, it causes your nose to only respond to use of afrin. Use only meds prescribed today. If symptoms do not improve in 3-5 days we should follow-up with ENT.    ED Prescriptions     Medication Sig Dispense Auth. Provider   fluticasone  (FLONASE ) 50 MCG/ACT nasal spray Place 1 spray into both nostrils daily. 16 g Kharis Lapenna C, PA-C   pseudoephedrine  (SUDAFED) 30 MG tablet Take 1 tablet (30 mg total) by mouth every 4 (four) hours as needed for congestion. 30 tablet Avir Deruiter C, PA-C   predniSONE  (DELTASONE ) 50 MG tablet Take 1 tab po daily for 5 days 5 tablet Andra Corean BROCKS, PA-C      PDMP not reviewed this encounter.    [1]  Social History Tobacco Use   Smoking status: Never    Passive exposure: Never   Smokeless tobacco: Never  Vaping Use   Vaping status: Never Used   Substance Use Topics   Alcohol  use: Yes    Alcohol /week: 1.0 standard drink of alcohol   Types: 1 Shots of liquor per week   Drug use: Not Currently    Types: Marijuana     Andra Corean BROCKS, PA-C 11/26/24 1202  "

## 2024-11-26 NOTE — ED Triage Notes (Signed)
 Pt presents c/o nasal congestion x several months. Pt states,  So I came in here in August and Sept for the same situation. Now, I'm back again. I been having a lot of congestion and it's not clearing up. I've been using a lot of nasal sprays to help me breathe like Afrin and Mucinex. I am not getting much sleep. Last Thursday I was bleeding a little bit from my nose.  Pt denies emesis, diarrhea, or any additional sxs.   Pt also, c/o leg numbness on left leg in a unique pattern. Starts at hip, travels down the outside thigh crosses over mid thigh to inner thigh and radiates down to L ankle.

## 2024-11-26 NOTE — Discharge Instructions (Addendum)
 Discontinue use of Afrin nasal sprays all of the nasal sprays you had in your pocket were Afrin or a generic form of Afrin, it causes your nose to only respond to use of afrin. Use only meds prescribed today. If symptoms do not improve in 3-5 days we should follow-up with ENT.
